# Patient Record
Sex: Female | Born: 1940 | Race: White | Hispanic: No | Marital: Single | Smoking: Never smoker
Health system: Southern US, Community
[De-identification: ages and names within clinical notes are randomized; demographics above are authoritative.]

## PROBLEM LIST (undated history)

## (undated) DIAGNOSIS — K635 Polyp of colon: Secondary | ICD-10-CM

## (undated) DIAGNOSIS — K219 Gastro-esophageal reflux disease without esophagitis: Secondary | ICD-10-CM

## (undated) DIAGNOSIS — Z9889 Other specified postprocedural states: Secondary | ICD-10-CM

## (undated) DIAGNOSIS — E119 Type 2 diabetes mellitus without complications: Secondary | ICD-10-CM

## (undated) DIAGNOSIS — K2270 Barrett's esophagus without dysplasia: Secondary | ICD-10-CM

## (undated) DIAGNOSIS — J45909 Unspecified asthma, uncomplicated: Secondary | ICD-10-CM

## (undated) DIAGNOSIS — I10 Essential (primary) hypertension: Secondary | ICD-10-CM

## (undated) DIAGNOSIS — K859 Acute pancreatitis without necrosis or infection, unspecified: Secondary | ICD-10-CM

## (undated) DIAGNOSIS — K589 Irritable bowel syndrome without diarrhea: Secondary | ICD-10-CM

## (undated) DIAGNOSIS — R112 Nausea with vomiting, unspecified: Secondary | ICD-10-CM

## (undated) DIAGNOSIS — M1990 Unspecified osteoarthritis, unspecified site: Secondary | ICD-10-CM

## (undated) DIAGNOSIS — D509 Iron deficiency anemia, unspecified: Secondary | ICD-10-CM

## (undated) DIAGNOSIS — E785 Hyperlipidemia, unspecified: Secondary | ICD-10-CM

## (undated) DIAGNOSIS — R7611 Nonspecific reaction to tuberculin skin test without active tuberculosis: Secondary | ICD-10-CM

## (undated) HISTORY — PX: BLADDER SUSPENSION: SHX72

## (undated) HISTORY — PX: APPENDECTOMY: SHX54

## (undated) HISTORY — PX: SALPINGECTOMY: SHX328

## (undated) HISTORY — PX: BREAST BIOPSY: SHX20

## (undated) HISTORY — PX: CHOLECYSTECTOMY: SHX55

---

## 2007-11-05 ENCOUNTER — Ambulatory Visit: Payer: Self-pay | Admitting: Internal Medicine

## 2008-08-09 ENCOUNTER — Ambulatory Visit: Payer: Self-pay | Admitting: Gastroenterology

## 2008-08-09 HISTORY — PX: COLONOSCOPY: SHX174

## 2008-08-31 ENCOUNTER — Ambulatory Visit: Payer: Self-pay | Admitting: Gastroenterology

## 2009-02-22 ENCOUNTER — Ambulatory Visit: Payer: Self-pay | Admitting: Internal Medicine

## 2011-03-08 ENCOUNTER — Ambulatory Visit: Payer: Self-pay | Admitting: Internal Medicine

## 2012-09-10 ENCOUNTER — Ambulatory Visit: Payer: Self-pay | Admitting: Internal Medicine

## 2012-09-30 ENCOUNTER — Ambulatory Visit: Payer: Self-pay | Admitting: Internal Medicine

## 2012-10-07 ENCOUNTER — Ambulatory Visit: Payer: Self-pay | Admitting: Obstetrics and Gynecology

## 2012-10-07 LAB — HEMOGLOBIN: HGB: 11.4 g/dL — ABNORMAL LOW (ref 12.0–16.0)

## 2012-10-07 LAB — BASIC METABOLIC PANEL
Anion Gap: 8 (ref 7–16)
BUN: 15 mg/dL (ref 7–18)
Calcium, Total: 9.6 mg/dL (ref 8.5–10.1)
Creatinine: 0.79 mg/dL (ref 0.60–1.30)
Glucose: 78 mg/dL (ref 65–99)
Osmolality: 275 (ref 275–301)
Sodium: 138 mmol/L (ref 136–145)

## 2012-10-13 ENCOUNTER — Ambulatory Visit: Payer: Self-pay | Admitting: Obstetrics and Gynecology

## 2012-10-14 LAB — PATHOLOGY REPORT

## 2013-04-30 HISTORY — PX: LAPAROSCOPIC BILATERAL SALPINGO OOPHERECTOMY: SHX5890

## 2013-10-09 ENCOUNTER — Inpatient Hospital Stay: Payer: Self-pay

## 2013-10-09 LAB — COMPREHENSIVE METABOLIC PANEL
ALBUMIN: 3.4 g/dL (ref 3.4–5.0)
ANION GAP: 12 (ref 7–16)
Alkaline Phosphatase: 50 U/L
BUN: 17 mg/dL (ref 7–18)
Bilirubin,Total: 0.6 mg/dL (ref 0.2–1.0)
CHLORIDE: 101 mmol/L (ref 98–107)
CREATININE: 0.66 mg/dL (ref 0.60–1.30)
Calcium, Total: 9.6 mg/dL (ref 8.5–10.1)
Co2: 21 mmol/L (ref 21–32)
EGFR (African American): >60
GLUCOSE: 198 mg/dL — AB (ref 65–99)
Osmolality: 275 (ref 275–301)
Potassium: 3.3 mmol/L — ABNORMAL LOW (ref 3.5–5.1)
SGOT(AST): 26 U/L (ref 15–37)
SGPT (ALT): 31 U/L (ref 12–78)
Sodium: 134 mmol/L — ABNORMAL LOW (ref 136–145)
TOTAL PROTEIN: 7.1 g/dL (ref 6.4–8.2)

## 2013-10-09 LAB — URINALYSIS, COMPLETE
BILIRUBIN, UR: NEGATIVE
Bacteria: NONE SEEN
Glucose,UR: NEGATIVE mg/dL (ref 0–75)
Leukocyte Esterase: NEGATIVE
NITRITE: NEGATIVE
PH: 5 (ref 4.5–8.0)
PROTEIN: NEGATIVE
RBC,UR: 5 /HPF (ref 0–5)
SPECIFIC GRAVITY: 1.017 (ref 1.003–1.030)

## 2013-10-09 LAB — CBC
HCT: 33.1 % — ABNORMAL LOW (ref 35.0–47.0)
HGB: 10.5 g/dL — AB (ref 12.0–16.0)
MCH: 25.8 pg — ABNORMAL LOW (ref 26.0–34.0)
MCHC: 31.8 g/dL — ABNORMAL LOW (ref 32.0–36.0)
MCV: 81 fL (ref 80–100)
Platelet: 237 10*3/uL (ref 150–440)
RBC: 4.08 10*6/uL (ref 3.80–5.20)
RDW: 18 % — ABNORMAL HIGH (ref 11.5–14.5)
WBC: 14.9 10*3/uL — ABNORMAL HIGH (ref 3.6–11.0)

## 2013-10-09 LAB — LIPASE, BLOOD: Lipase: 157 U/L (ref 73–393)

## 2013-10-09 LAB — TROPONIN I

## 2013-10-10 LAB — CBC WITH DIFFERENTIAL/PLATELET
BASOS PCT: 0.3 %
Basophil #: 0 10*3/uL (ref 0.0–0.1)
EOS ABS: 0.1 10*3/uL (ref 0.0–0.7)
EOS PCT: 0.6 %
HCT: 30.2 % — ABNORMAL LOW (ref 35.0–47.0)
HGB: 9.7 g/dL — AB (ref 12.0–16.0)
Lymphocyte #: 1.6 10*3/uL (ref 1.0–3.6)
Lymphocyte %: 17.2 %
MCH: 26.2 pg (ref 26.0–34.0)
MCHC: 32.2 g/dL (ref 32.0–36.0)
MCV: 81 fL (ref 80–100)
Monocyte #: 0.8 x10 3/mm (ref 0.2–0.9)
Monocyte %: 8.2 %
NEUTROS ABS: 6.8 10*3/uL — AB (ref 1.4–6.5)
NEUTROS PCT: 73.7 %
PLATELETS: 220 10*3/uL (ref 150–440)
RBC: 3.72 10*6/uL — AB (ref 3.80–5.20)
RDW: 18.3 % — ABNORMAL HIGH (ref 11.5–14.5)
WBC: 9.2 10*3/uL (ref 3.6–11.0)

## 2013-10-10 LAB — BASIC METABOLIC PANEL
Anion Gap: 7 (ref 7–16)
BUN: 11 mg/dL (ref 7–18)
Calcium, Total: 8.8 mg/dL (ref 8.5–10.1)
Chloride: 113 mmol/L — ABNORMAL HIGH (ref 98–107)
Co2: 23 mmol/L (ref 21–32)
Creatinine: 0.63 mg/dL (ref 0.60–1.30)
EGFR (African American): >60
Glucose: 66 mg/dL (ref 65–99)
Osmolality: 283 (ref 275–301)
Potassium: 3.8 mmol/L (ref 3.5–5.1)
SODIUM: 143 mmol/L (ref 136–145)

## 2013-10-12 LAB — URINE CULTURE

## 2013-10-12 LAB — BASIC METABOLIC PANEL
Anion Gap: 6 — ABNORMAL LOW (ref 7–16)
BUN: 8 mg/dL (ref 7–18)
CO2: 25 mmol/L (ref 21–32)
CREATININE: 0.78 mg/dL (ref 0.60–1.30)
Calcium, Total: 9 mg/dL (ref 8.5–10.1)
Chloride: 110 mmol/L — ABNORMAL HIGH (ref 98–107)
EGFR (African American): >60
EGFR (Non-African Amer.): >60
Glucose: 100 mg/dL — ABNORMAL HIGH (ref 65–99)
Osmolality: 280 (ref 275–301)
Potassium: 3.6 mmol/L (ref 3.5–5.1)
SODIUM: 141 mmol/L (ref 136–145)

## 2013-10-12 LAB — CBC WITH DIFFERENTIAL/PLATELET
Basophil #: 0.1 10*3/uL (ref 0.0–0.1)
Basophil %: 1 %
EOS ABS: 0.1 10*3/uL (ref 0.0–0.7)
EOS PCT: 2.2 %
HCT: 28.4 % — AB (ref 35.0–47.0)
HGB: 9.4 g/dL — AB (ref 12.0–16.0)
LYMPHS PCT: 22.8 %
Lymphocyte #: 1.4 10*3/uL (ref 1.0–3.6)
MCH: 26.4 pg (ref 26.0–34.0)
MCHC: 33 g/dL (ref 32.0–36.0)
MCV: 80 fL (ref 80–100)
Monocyte #: 0.7 x10 3/mm (ref 0.2–0.9)
Monocyte %: 10.8 %
NEUTROS PCT: 63.2 %
Neutrophil #: 3.9 10*3/uL (ref 1.4–6.5)
PLATELETS: 253 10*3/uL (ref 150–440)
RBC: 3.55 10*6/uL — ABNORMAL LOW (ref 3.80–5.20)
RDW: 17.8 % — AB (ref 11.5–14.5)
WBC: 6.2 10*3/uL (ref 3.6–11.0)

## 2013-10-14 LAB — CULTURE, BLOOD (SINGLE)

## 2013-11-21 ENCOUNTER — Emergency Department: Payer: Self-pay | Admitting: Emergency Medicine

## 2014-04-28 ENCOUNTER — Ambulatory Visit: Payer: Self-pay | Admitting: Internal Medicine

## 2014-05-07 ENCOUNTER — Ambulatory Visit: Payer: Self-pay | Admitting: Internal Medicine

## 2014-06-07 ENCOUNTER — Ambulatory Visit: Payer: Self-pay | Admitting: Gastroenterology

## 2014-06-07 HISTORY — PX: ESOPHAGOGASTRODUODENOSCOPY: SHX1529

## 2014-06-07 HISTORY — PX: COLONOSCOPY: SHX174

## 2014-06-21 ENCOUNTER — Ambulatory Visit: Payer: Self-pay | Admitting: Gastroenterology

## 2014-07-15 ENCOUNTER — Ambulatory Visit
Admit: 2014-07-15 | Disposition: A | Discharge status: Home / Self Care | Payer: Self-pay | Attending: Cardiothoracic Surgery | Admitting: Cardiothoracic Surgery

## 2014-07-30 ENCOUNTER — Ambulatory Visit
Admit: 2014-07-30 | Disposition: A | Discharge status: Home / Self Care | Payer: Self-pay | Attending: Cardiothoracic Surgery | Admitting: Cardiothoracic Surgery

## 2014-08-20 NOTE — Op Note (Signed)
PATIENT NAME:  [name], [name] DATE OF BIRTH:  1940-10-29  DATE OF PROCEDURE:  10/13/2012  PREOPERATIVE DIAGNOSIS: Complex right ovarian cyst.   POSTOPERATIVE DIAGNOSES:  1.  Significant pelvic adhesions.  2.  Complex right ovarian cyst.   PROCEDURE:  1.  Pelvic adhesiolysis incorporating greater than 50% of operating time. 2.  Bilateral salpingo-oophorectomy.   ANESTHESIA: General endotracheal anesthesia.  SURGEON: Suzy Bouchardhomas J Verdelle Valtierra, M.D.   FIRST ASSISTANT: [name] BoresEvans.   INDICATIONS: This is a [age] year old gravida 3, para 2 patient with a noted complex right adnexal mass measuring 3.8 x 3.1 with papillations within the ovarian mass. Normal CA-125.   PROCEDURE: After adequate general endotracheal anesthesia, the patient was placed in the dorsal supine position. The abdomen, perineum and vagina were prepped and draped in normal sterile fashion. A single-tooth tenaculum applied on the anterior cervix and the bladder was straight catheterized yielding 75 mL clear urine. An infraumbilical incision was made approximately 11 mm after injecting with 0.5% Marcaine. The laparoscope was advanced into the abdominal cavity with the Optiview cannula. The patient's abdomen was insufflated with carbon dioxide and a second port site was made 2 fingerbreadths medial to the left anterior iliac spine. Direct visualization was used to advance a 10 mm trocar into the left lower quadrant. A third port was placed 3 cm medial to the right anterior iliac spine. A 5 mm trocar was advanced into the abdominal cavity under direct visualization. Initial impression was bilateral ovarian encasement to the ovarian fossa and several adhesions noted from the posterior aspect of the uterus to the bowel.   Attention was then directed to the patient's left fallopian tube, which again was adhesed and encased into the left ovarian fossa. Gentle and meticulous dissection performed with traction, countertraction and  Harmonic scalpel allowed for fraying of the ovary. Ureter was identified with normal peristaltic activity pre- and post removal of tube and ovary. The infundibulopelvic ligament was then cauterized with Kleppingers and Harmonic scalpel. Ultimately, the left fallopian tube and ovary were removed without difficulty. They were placed in an Endobag and removed from the abdomen.   Attention was directed to the patient's right fallopian tube, which again had multiple adhesions onto the sidewall. Again,  meticulous dissection was utilized after identifying the course of the right ureter. The infundibulopelvic ligament was dissected, cauterized and the right fallopian tube and ovary were removed with the Harmonic scalpel. The uterus appeared approximately eight weeks in size with a bulbous component to the midportion consistent with a fibroid. Good hemostasis was noted. At the end of the case, good peristaltic activity identified from the ureters bilaterally. The patient's right fallopian tube and ovary was removed intact. The patient's abdomen was then inspected. The upper abdomen appeared normal. The patient's abdomen was deflated and the infraumbilical and left lower quadrant port sites were closed in 2 layers with a fascial layer closed with 2-0 Vicryl suture and all 3 incisions were closed with interrupted 4-0 Vicryl sutures. Steri-Strips were applied. Tegaderm applied. Single-tooth tenaculum was removed from the anterior cervix. Good hemostasis was noted. There were no complications. The patient tolerated the procedure well and was taken to the recovery room in good condition.  ____________________________ Suzy Bouchardhomas J. Connar Keating, MD tjs:aw D: 10/13/2012 11:07:14 ET T: 10/13/2012 11:22:56 ET JOB#: [id number]  cc: Suzy Bouchardhomas J. Delenn Ahn, MD, <Dictator> Suzy BouchardHOMAS J Beni Turrell MD ELECTRONICALLY SIGNED 10/17/2012 8:37

## 2014-08-21 NOTE — H&P (Signed)
PATIENT NAME:  [name], [name] MR#:  [id number] DATE OF BIRTH:  07-25-1940  DATE OF ADMISSION:  10/09/2013  ADMITTING PHYSICIAN: Gladstone Lighter, MD  PRIMARY CARE PHYSICIAN: Mckenzie Filbert, MD  CHIEF COMPLAINT: Epigastric abdominal pain and also dysuria.   HISTORY OF PRESENT ILLNESS: [name] is a very pleasant 74 year old Caucasian female with past medical history significant for hypertension, diabetes, arthritis and asthma who lives at home by herself, comes to the ER with the above-mentioned complaints. The patient says she started feeling sick about 3 to 4 days ago. She could not pinpoint what exactly the cause was. She was having some epigastric pain, right-sided chest pain whenever she takes deep breaths or moves. She also noticed that she was having epigastric abdominal pain with pain on urination, burning and dribbling with urination. Her symptoms got worse late yesterday and this morning she was not feeling any better. She had a temperature of 101 degrees Fahrenheit at home so presents to the hospital. Here she has a fever of 99.4 degrees Fahrenheit. She was tachycardic with pulse of 126 and an elevated white count. Her urine did not reveal any infection, but she does have an infiltrate on chest x-ray and is being admitted for possible sepsis from pneumonia.   PAST MEDICAL HISTORY: 1.  Diabetes mellitus.  2.  Hypertension.  3.  Osteoarthritis.  4.  Asthma.   PAST SURGICAL HISTORY: 1.  Cholecystectomy.  2.  Bilateral salpingo-oophorectomy for benign right complex ovarian cyst. 3.  Appendectomy. 4.  Uterine suspension.  ALLERGIES:  1.  Augmentin.  2.  Codeine.  3.  Demerol. 4.  Levaquin.  5.  Morphine.  6.  Sulfa drugs.  7.  Wine and raisins.  CURRENT HOME MEDICATIONS:  1.  Aspirin 81 mg p.o. daily.  2.  Glimepiride 4 mg p.o. daily.  3.  Metformin 1000 mg p.o. b.i.d.  4.  Centrum Silver multivitamin 1 tablet p.o. daily.  5.  HCTZ/triamterene 25/37.5 mg 1 tablet  daily.  6.  Klor-Con 20 mEq p.o. daily.  7.  Lipitor 40 mg p.o. daily.  8.  Lisinopril 5 mg p.o. daily.  9.  Magnesium oxide 500 mg p.o. at bedtime.  10.  Metoprolol extended release 100 mg p.o. daily.  11.  Prilosec 20 mg p.o. daily.  12.  Pulmicort 180 mcg/inhalation powder 1 puff once a day.  13.  TriCor 145 mg p.o. daily.  14.  Ventolin inhaler 2 puffs 4 times a day as needed for shortness of breath.  15.  Zyrtec 10 mg p.o. daily as needed for allergies.   SOCIAL HISTORY: Lives at home by herself. Very active at baseline, even volunteers at church for several activities. No history of any smoking or alcohol use.   FAMILY HISTORY: Significant for diabetes running in the family and sister with breast cancer.   REVIEW OF SYSTEMS: CONSTITUTIONAL: Positive for fever, fatigue and weakness.  EYES: No blurred vision, double vision, inflammation or glaucoma.  ENT: No tinnitus, ear pain, hearing loss, epistaxis or discharge.  RESPIRATORY: No cough, wheeze, hemoptysis or COPD. CARDIOVASCULAR: Positive for chest pain. No orthopnea, edema, arrhythmia, palpitations, or syncope.  GASTROINTESTINAL: No nausea, vomiting, or diarrhea. Positive for abdominal pain. No hematemesis or melena.  GENITOURINARY: Positive for dysuria. No hematuria. No frequency or incontinence.  ENDOCRINE: No polyuria, nocturia, thyroid problems, heat or cold intolerance.  HEMATOLOGY: No anemia, easy bruising or bleeding.  SKIN: No acne, rash or lesions.  MUSCULOSKELETAL: Positive for arthritis. No gout. No  neck, back or shoulder pain.  NEUROLOGIC: No numbness, weakness, CVA, TIA or seizures.  PSYCHOLOGICAL: No anxiety, insomnia, depression.   PHYSICAL EXAMINATION: VITAL SIGNS: Temperature 99.4 degrees Fahrenheit, pulse 126, respirations 18, blood pressure 150/72, pulse ox 94% on room air.  GENERAL: Well-built, well-nourished female lying in bed, not in any acute distress.  HEENT: Normocephalic, atraumatic. Pupils equal,  round and reacting to light. Anicteric sclerae. Extraocular movements intact. Oropharynx clear without erythema, mass or exudates. Very dry mucous membranes are noted.  NECK: Supple. No thyromegaly, JVD or carotid bruits. No lymphadenopathy. Normal range of motion without pain.  LUNGS: Clear to auscultation bilaterally. No wheeze or crackles. No use of accessory muscles for breathing.  HEART:  S1, S2. Regular rate and rhythm. A 3/6 systolic murmur heard. No rubs or gallops.  ABDOMEN: Soft. Mild discomfort in the right upper quadrant and also in the hypogastric region. No guarding or rigidity. No hepatosplenomegaly. Normal bowel sounds.  EXTREMITIES: No pedal edema. No clubbing or cyanosis. 2+ dorsalis pedis pulses palpable bilaterally.  SKIN: No acne, rash or lesions.  LYMPHATICS: No cervical lymphadenopathy.  NEUROLOGIC: Cranial nerves intact. No focal motor or sensory deficits.  PSYCHIATRIC: The patient is awake, alert and oriented x3.  DIAGNOSTIC DATA: WBC 14.9, hemoglobin 10.5, hematocrit 33.1, platelet count 237,000.   Sodium 134, potassium 3.3, chloride 101, bicarb 21, BUN 17, creatinine 0.6, glucose 198, calcium 9.6.   ALT 31, AST 26, alk phos 50, total bili 0.6, albumin 3.4. Troponin less than 0.02. Lipase 157.  Urinalysis: Negative for any infection. Lactic acid 0.8.   CT of the abdomen and pelvis is showing large esophageal hiatal hernia containing much of the stomach, calcified granulomas are noted in the lungs, hilum and spleen. Patchy nodular ground-glass infiltrates superior segmental left lower lobe and left lingula, probably due to aspiration pneumonia or inflammatory process. No ureteral or renal stone or obstruction noted. Calcified fibroids in uterus are noted.   EKG showing sinus tachycardia, heart rate of 121. No acute ST-T wave abnormalities.   ASSESSMENT AND PLAN: This is a 74 year old female with hypertension, diabetes, asthma, and arthritis admitted for sepsis.  1.   Sepsis, likely secondary to left lower lobe pneumonia as seen on chest x-ray and CT. Blood cultures have been ordered. Started on Rocephin and azithromycin. IV fluids and antibiotics with Rocephin and azithromycin started as well.  2.  Dysuria and abdominal pain. Urinalysis is negative. It is more in the right upper quadrant. Not sure if it is like pain from her esophageal hiatal hernia and she could also have bladder spasms.  CT of the abdomen is done. No adhesions or obstruction noted.  3.  Hypertension. Continue home medications.  4.  Hypokalemia. It is being replaced.  5.  Diabetes mellitus. Continue sliding scale insulin, metformin and glimepiride.  6.  Asthma. Stable on inhalers.  7.  Deep vein thrombosis prophylaxis with Lovenox.  CODE STATUS: FULL.  TIME SPENT ON ADMISSION: 50 minutes.   ____________________________ Gladstone Lighter, MD rk:sb D: 10/09/2013 08:58:27 ET T: 10/09/2013 09:46:57 ET JOB#: [id number]  cc: Gladstone Lighter, MD, <Dictator> Rusty Aus, MD Gladstone Lighter MD ELECTRONICALLY SIGNED 10/10/2013 15:48

## 2014-08-21 NOTE — Discharge Summary (Signed)
PATIENT NAME:  [name], [name] MR#:  [id number] DATE OF BIRTH:  [birth year]  DATE OF ADMISSION:  10/09/2013 DATE OF DISCHARGE:  10/12/2013  DISCHARGE DIAGNOSES:  1. Sepsis.  2. Pneumonia.  3. Diabetes.  4. Hypertension.   HISTORY OF PRESENT ILLNESS: This is a pleasant 74 year old female with history of diabetes, hypertension, asthma, and arthritis, who came in with fever, tachycardia, and abdominal pain. She had a CT on chest x-ray that showed pneumonia. Abdomen x-ray, was negative for any source of abdominal pain except a large hiatal hernia. Blood cultures were negative. UA and urine cultures were also negative. The patient was treated with IV antibiotics, ceftriaxone and azithromycin as well as fluids. Her fever improved. Her symptoms improved, and she remained afebrile and off of oxygen. White blood count decreased from 14.9 to 6.2.   Her sugars did have some decreases of hypoglycemia but she was being covered with sliding scale insulin. Her blood pressure remained well controlled.   DISCHARGE MEDICATIONS: Please see physician discharge summary. Briefly, she will continue her outpatient medications except she will restart amaryl  tomorrow at half a tablet. She can increase this as needed if her sugars remain elevated. She was advised I would rather her sugars be a little high than a little low until she resumes her normal oral intake. I have also started her on azithromycin and she will finish 3 more days.   DISCHARGE DIET: Carbohydrate, ADA-controlled diet, regular consistency.   DISCHARGE FOLLOWUP: Dr. Hyacinth MeekerMiller in 1-2 weeks.  TIME SPENT: This discharge took 35 minutes.    ____________________________ Stann Mainlandavid P. Sampson GoonFitzgerald, MD dpf:lt D: 10/12/2013 15:09:16 ET T: 10/12/2013 19:35:58 ET JOB#: [id number]  cc: Stann Mainlandavid P. Sampson GoonFitzgerald, MD, <Dictator> Oswin Johal Sampson GoonFITZGERALD MD ELECTRONICALLY SIGNED 10/13/2013 6:50

## 2014-08-23 LAB — SURGICAL PATHOLOGY

## 2014-08-30 ENCOUNTER — Other Ambulatory Visit: Payer: Self-pay | Admitting: Surgery

## 2014-08-30 DIAGNOSIS — K2100 Gastro-esophageal reflux disease with esophagitis, without bleeding: Secondary | ICD-10-CM

## 2014-08-30 DIAGNOSIS — K449 Diaphragmatic hernia without obstruction or gangrene: Secondary | ICD-10-CM

## 2014-09-01 ENCOUNTER — Ambulatory Visit
Admission: RE | Admit: 2014-09-01 | Discharge: 2014-09-01 | Disposition: A | Discharge status: Home / Self Care | Payer: Medicare Other | Source: Ambulatory Visit | Attending: Surgery | Admitting: Surgery

## 2014-09-01 DIAGNOSIS — K2100 Gastro-esophageal reflux disease with esophagitis, without bleeding: Secondary | ICD-10-CM

## 2014-09-01 DIAGNOSIS — K449 Diaphragmatic hernia without obstruction or gangrene: Secondary | ICD-10-CM

## 2014-09-01 HISTORY — DX: Essential (primary) hypertension: I10

## 2014-09-01 HISTORY — DX: Gastro-esophageal reflux disease without esophagitis: K21.9

## 2014-09-01 HISTORY — DX: Irritable bowel syndrome without diarrhea: K58.9

## 2014-09-01 HISTORY — DX: Type 2 diabetes mellitus without complications: E11.9

## 2014-09-01 HISTORY — DX: Unspecified asthma, uncomplicated: J45.909

## 2014-09-01 MED ORDER — TECHNETIUM TC 99M SULFUR COLLOID
2.1600 | Freq: Once | INTRAVENOUS | Status: AC | PRN
  Administered 2014-09-01: 2.16 via ORAL

## 2014-09-22 ENCOUNTER — Ambulatory Visit
Admission: RE | Admit: 2014-09-22 | Discharge: 2014-09-22 | Disposition: A | Discharge status: Home / Self Care | Payer: Medicare Other | Source: Ambulatory Visit | Attending: Gastroenterology | Admitting: Gastroenterology

## 2014-09-22 ENCOUNTER — Encounter
Admission: RE | Disposition: A | Discharge status: Home / Self Care | Payer: Self-pay | Source: Ambulatory Visit | Attending: Gastroenterology

## 2014-09-22 DIAGNOSIS — R12 Heartburn: Secondary | ICD-10-CM | POA: Insufficient documentation

## 2014-09-22 DIAGNOSIS — Z7951 Long term (current) use of inhaled steroids: Secondary | ICD-10-CM | POA: Insufficient documentation

## 2014-09-22 DIAGNOSIS — R05 Cough: Secondary | ICD-10-CM | POA: Diagnosis not present

## 2014-09-22 DIAGNOSIS — Z79899 Other long term (current) drug therapy: Secondary | ICD-10-CM | POA: Insufficient documentation

## 2014-09-22 HISTORY — PX: ESOPHAGEAL MANOMETRY: SHX5429

## 2014-09-22 SURGERY — MANOMETRY, ESOPHAGUS
Anesthesia: General

## 2014-09-22 MED ORDER — BUTAMBEN-TETRACAINE-BENZOCAINE 2-2-14 % EX AERO
1.0000 | INHALATION_SPRAY | Freq: Once | CUTANEOUS | Status: AC
  Administered 2014-09-22: 1 via TOPICAL

## 2014-09-22 MED ORDER — LIDOCAINE HCL 2 % EX GEL
1.0000 "application " | Freq: Once | CUTANEOUS | Status: AC
  Administered 2014-09-22: 5 via TOPICAL

## 2014-09-22 SURGICAL SUPPLY — 2 items
FACESHIELD LNG OPTICON STERILE (SAFETY) IMPLANT
GLOVE BIO SURGEON STRL SZ8 (GLOVE) ×6 IMPLANT

## 2014-09-23 ENCOUNTER — Encounter: Payer: Self-pay | Admitting: Gastroenterology

## 2014-10-28 ENCOUNTER — Encounter
Admission: RE | Admit: 2014-10-28 | Discharge: 2014-10-28 | Disposition: A | Discharge status: Home / Self Care | Payer: Medicare Other | Source: Ambulatory Visit | Attending: Internal Medicine | Admitting: Internal Medicine

## 2014-10-28 DIAGNOSIS — Z01812 Encounter for preprocedural laboratory examination: Secondary | ICD-10-CM | POA: Insufficient documentation

## 2014-10-28 HISTORY — DX: Acute pancreatitis without necrosis or infection, unspecified: K85.90

## 2014-10-28 HISTORY — DX: Unspecified osteoarthritis, unspecified site: M19.90

## 2014-10-28 NOTE — OR Nursing (Signed)
Called office regarding allergy to Levaquin and Augmentin.

## 2014-10-28 NOTE — Patient Instructions (Signed)
  Your procedure is scheduled on: 11/04/14 Thurs Report to Day Surgery. To find out your arrival time please call [phone] between 1PM - 3PM on 11/03/14 Wed.  Remember: Instructions that are not followed completely may result in serious medical risk, up to and including death, or upon the discretion of your surgeon and anesthesiologist your surgery may need to be rescheduled.    _x___ 1. Do not eat food or drink liquids after midnight. No gum chewing or hard candies.     ____ 2. No Alcohol for 24 hours before or after surgery.   ____ 3. Bring all medications with you on the day of surgery if instructed.    __x__ 4. Notify your doctor if there is any change in your medical condition     (cold, fever, infections).     Do not wear jewelry, make-up, hairpins, clips or nail polish.  Do not wear lotions, powders, or perfumes. You may wear deodorant.  Do not shave 48 hours prior to surgery. Men may shave face and neck.  Do not bring valuables to the hospital.    [facility] is not responsible for any belongings or valuables.               Contacts, dentures or bridgework may not be worn into surgery.  Leave your suitcase in the car. After surgery it may be brought to your room.  For patients admitted to the hospital, discharge time is determined by your                treatment team.   Patients discharged the day of surgery will not be allowed to drive home.   Please read over the following fact sheets that you were given:      __x__ Take these medicines the morning of surgery with A SIP OF WATER:    1. albuterol (PROVENTIL HFA;VENTOLIN HFA) 108 (90 BASE) MCG/ACT inhaler  2. fluticasone-salmeterol (ADVAIR HFA) 115-21 MCG/ACT inhaler  3. metoprolol succinate (TOPROL-XL) 100 MG 24 hr tablet  4.montelukast (SINGULAIR) 10 MG tablet  5.omeprazole (PRILOSEC) 20 MG capsule  6.  ____ Fleet Enema (as directed)   __x__ Use CHG Soap as directed  ____ Use inhalers on the day of  surgery  __x__ Stop metformin 2 days prior to surgery    ____ Take 1/2 of usual insulin dose the night before surgery and none on the morning of surgery.   _x___ Stop Coumadin/Plavix/aspirin on stop aspirin 10/28/14  ____ Stop Anti-inflammatories on    ____ Stop supplements until after surgery.    ____ Bring C-Pap to the hospital.

## 2014-11-04 ENCOUNTER — Ambulatory Visit: Payer: Medicare Other | Admitting: Anesthesiology

## 2014-11-04 ENCOUNTER — Encounter
Admission: RE | Disposition: A | Discharge status: Home / Self Care | Payer: Self-pay | Source: Ambulatory Visit | Attending: Surgery

## 2014-11-04 ENCOUNTER — Encounter: Payer: Self-pay | Admitting: Anesthesiology

## 2014-11-04 ENCOUNTER — Inpatient Hospital Stay
Admission: RE | Admit: 2014-11-04 | Discharge: 2014-11-09 | DRG: 328 | Disposition: A | Discharge status: Home / Self Care | Payer: Medicare Other | Source: Ambulatory Visit | Attending: Surgery | Admitting: Surgery

## 2014-11-04 DIAGNOSIS — K210 Gastro-esophageal reflux disease with esophagitis: Secondary | ICD-10-CM | POA: Diagnosis present

## 2014-11-04 DIAGNOSIS — K449 Diaphragmatic hernia without obstruction or gangrene: Secondary | ICD-10-CM | POA: Diagnosis present

## 2014-11-04 DIAGNOSIS — J45909 Unspecified asthma, uncomplicated: Secondary | ICD-10-CM | POA: Diagnosis present

## 2014-11-04 DIAGNOSIS — R1319 Other dysphagia: Secondary | ICD-10-CM | POA: Diagnosis not present

## 2014-11-04 DIAGNOSIS — I10 Essential (primary) hypertension: Secondary | ICD-10-CM | POA: Diagnosis present

## 2014-11-04 DIAGNOSIS — M1990 Unspecified osteoarthritis, unspecified site: Secondary | ICD-10-CM | POA: Diagnosis present

## 2014-11-04 DIAGNOSIS — D508 Other iron deficiency anemias: Secondary | ICD-10-CM | POA: Diagnosis present

## 2014-11-04 DIAGNOSIS — E119 Type 2 diabetes mellitus without complications: Secondary | ICD-10-CM | POA: Diagnosis present

## 2014-11-04 DIAGNOSIS — K219 Gastro-esophageal reflux disease without esophagitis: Secondary | ICD-10-CM | POA: Diagnosis present

## 2014-11-04 HISTORY — PX: LAPAROSCOPIC NISSEN FUNDOPLICATION: SHX1932

## 2014-11-04 HISTORY — PX: HIATAL HERNIA REPAIR: SHX195

## 2014-11-04 SURGERY — REPAIR, HERNIA, HIATAL, LAPAROSCOPIC
Anesthesia: General | Wound class: Clean

## 2014-11-04 MED ORDER — MIDAZOLAM HCL 5 MG/5ML IJ SOLN
INTRAMUSCULAR | Status: DC | PRN
  Administered 2014-11-04: 2 mg via INTRAVENOUS

## 2014-11-04 MED ORDER — ACETAMINOPHEN 10 MG/ML IV SOLN
INTRAVENOUS | Status: DC | PRN
  Administered 2014-11-04: 1000 mg via INTRAVENOUS

## 2014-11-04 MED ORDER — ACETAMINOPHEN 325 MG PO TABS: 650.0000 mg | ORAL_TABLET | ORAL | Status: DC | PRN

## 2014-11-04 MED ORDER — KCL IN DEXTROSE-NACL 20-5-0.2 MEQ/L-%-% IV SOLN
INTRAVENOUS | Status: DC
  Administered 2014-11-04 – 2014-11-07 (×2): via INTRAVENOUS
  Filled 2014-11-04 (×10): qty 1000

## 2014-11-04 MED ORDER — EPHEDRINE SULFATE 50 MG/ML IJ SOLN
INTRAMUSCULAR | Status: DC | PRN
  Administered 2014-11-04 (×3): 10 mg via INTRAVENOUS

## 2014-11-04 MED ORDER — HYDROCODONE-ACETAMINOPHEN 5-325 MG PO TABS
ORAL_TABLET | ORAL | Status: AC
  Administered 2014-11-04: 1
  Filled 2014-11-04: qty 1

## 2014-11-04 MED ORDER — BUPIVACAINE-EPINEPHRINE (PF) 0.5% -1:200000 IJ SOLN
INTRAMUSCULAR | Status: DC | PRN
  Administered 2014-11-04: 25 mL

## 2014-11-04 MED ORDER — HEPARIN SODIUM (PORCINE) 5000 UNIT/ML IJ SOLN
INTRAMUSCULAR | Status: AC
  Filled 2014-11-04: qty 1

## 2014-11-04 MED ORDER — GLYCOPYRROLATE 0.2 MG/ML IJ SOLN
INTRAMUSCULAR | Status: DC | PRN
  Administered 2014-11-04: 0.6 mg via INTRAVENOUS

## 2014-11-04 MED ORDER — NEOSTIGMINE METHYLSULFATE 10 MG/10ML IV SOLN
INTRAVENOUS | Status: DC | PRN
  Administered 2014-11-04: 4 mg via INTRAVENOUS

## 2014-11-04 MED ORDER — HYDROCODONE-ACETAMINOPHEN 5-325 MG PO TABS
1.0000 | ORAL_TABLET | ORAL | Status: DC | PRN
  Administered 2014-11-05: 1 via ORAL
  Administered 2014-11-05 (×2): 2 via ORAL
  Filled 2014-11-04: qty 1
  Filled 2014-11-04 (×2): qty 2

## 2014-11-04 MED ORDER — HEPARIN SODIUM (PORCINE) 5000 UNIT/ML IJ SOLN
5000.0000 [IU] | Freq: Two times a day (BID) | INTRAMUSCULAR | Status: DC
  Administered 2014-11-05 – 2014-11-08 (×8): 5000 [IU] via SUBCUTANEOUS
  Filled 2014-11-04 (×8): qty 1

## 2014-11-04 MED ORDER — ACETAMINOPHEN 650 MG RE SUPP: 650.0000 mg | Freq: Four times a day (QID) | RECTAL | Status: DC | PRN

## 2014-11-04 MED ORDER — BUPIVACAINE-EPINEPHRINE (PF) 0.5% -1:200000 IJ SOLN
INTRAMUSCULAR | Status: AC
  Filled 2014-11-04: qty 30

## 2014-11-04 MED ORDER — TRIAMTERENE-HCTZ 37.5-25 MG PO TABS
1.0000 | ORAL_TABLET | Freq: Every day | ORAL | Status: DC
  Administered 2014-11-05 – 2014-11-09 (×4): 1 via ORAL
  Filled 2014-11-04 (×4): qty 1

## 2014-11-04 MED ORDER — FENTANYL CITRATE (PF) 100 MCG/2ML IJ SOLN: 25.0000 ug | INTRAMUSCULAR | Status: DC | PRN

## 2014-11-04 MED ORDER — PANTOPRAZOLE SODIUM 40 MG PO TBEC
40.0000 mg | DELAYED_RELEASE_TABLET | Freq: Every day | ORAL | Status: DC
  Administered 2014-11-05 – 2014-11-06 (×2): 40 mg via ORAL
  Filled 2014-11-04 (×2): qty 1

## 2014-11-04 MED ORDER — PROPOFOL 10 MG/ML IV BOLUS
INTRAVENOUS | Status: DC | PRN
  Administered 2014-11-04: 120 mg via INTRAVENOUS
  Administered 2014-11-04: 20 mg via INTRAVENOUS

## 2014-11-04 MED ORDER — SODIUM CHLORIDE 0.9 % IV SOLN
INTRAVENOUS | Status: DC
  Administered 2014-11-04 (×2): via INTRAVENOUS

## 2014-11-04 MED ORDER — CEFAZOLIN SODIUM 1-5 GM-% IV SOLN
INTRAVENOUS | Status: AC
  Administered 2014-11-04: 1 g via INTRAVENOUS
  Filled 2014-11-04: qty 50

## 2014-11-04 MED ORDER — LISINOPRIL 5 MG PO TABS
5.0000 mg | ORAL_TABLET | Freq: Every day | ORAL | Status: DC
  Administered 2014-11-06 – 2014-11-09 (×3): 5 mg via ORAL
  Filled 2014-11-04 (×4): qty 1

## 2014-11-04 MED ORDER — MONTELUKAST SODIUM 10 MG PO TABS
10.0000 mg | ORAL_TABLET | Freq: Every morning | ORAL | Status: DC
  Administered 2014-11-05 – 2014-11-09 (×4): 10 mg via ORAL
  Filled 2014-11-04 (×4): qty 1

## 2014-11-04 MED ORDER — ONDANSETRON HCL 4 MG/2ML IJ SOLN
INTRAMUSCULAR | Status: AC
  Filled 2014-11-04: qty 2

## 2014-11-04 MED ORDER — HYDROMORPHONE HCL 1 MG/ML IJ SOLN: 0.5000 mg | INTRAMUSCULAR | Status: DC | PRN

## 2014-11-04 MED ORDER — ONDANSETRON HCL 4 MG/2ML IJ SOLN
4.0000 mg | Freq: Once | INTRAMUSCULAR | Status: AC
  Administered 2014-11-04: 4 mg via INTRAVENOUS

## 2014-11-04 MED ORDER — CEFAZOLIN SODIUM 1-5 GM-% IV SOLN
1.0000 g | Freq: Once | INTRAVENOUS | Status: AC
  Administered 2014-11-04 (×2): 1 g via INTRAVENOUS

## 2014-11-04 MED ORDER — LIDOCAINE HCL (CARDIAC) 20 MG/ML IV SOLN
INTRAVENOUS | Status: DC | PRN
  Administered 2014-11-04: 50 mg via INTRAVENOUS

## 2014-11-04 MED ORDER — ONDANSETRON HCL 4 MG/2ML IJ SOLN
INTRAMUSCULAR | Status: DC | PRN
  Administered 2014-11-04: 4 mg via INTRAVENOUS

## 2014-11-04 MED ORDER — MOMETASONE FURO-FORMOTEROL FUM 100-5 MCG/ACT IN AERO
2.0000 | INHALATION_SPRAY | Freq: Two times a day (BID) | RESPIRATORY_TRACT | Status: DC
  Administered 2014-11-05 – 2014-11-09 (×9): 2 via RESPIRATORY_TRACT
  Filled 2014-11-04: qty 8.8

## 2014-11-04 MED ORDER — ONDANSETRON HCL 4 MG/2ML IJ SOLN: 4.0000 mg | Freq: Four times a day (QID) | INTRAMUSCULAR | Status: DC | PRN

## 2014-11-04 MED ORDER — FENTANYL CITRATE (PF) 250 MCG/5ML IJ SOLN
INTRAMUSCULAR | Status: DC | PRN
  Administered 2014-11-04: 100 ug via INTRAVENOUS
  Administered 2014-11-04: 50 ug via INTRAVENOUS
  Administered 2014-11-04: 25 ug via INTRAVENOUS
  Administered 2014-11-04: 50 ug via INTRAVENOUS
  Administered 2014-11-04: 25 ug via INTRAVENOUS

## 2014-11-04 MED ORDER — METOPROLOL SUCCINATE ER 100 MG PO TB24
100.0000 mg | ORAL_TABLET | Freq: Every day | ORAL | Status: DC
  Administered 2014-11-05 – 2014-11-09 (×4): 100 mg via ORAL
  Filled 2014-11-04 (×4): qty 1

## 2014-11-04 MED ORDER — ROCURONIUM BROMIDE 100 MG/10ML IV SOLN
INTRAVENOUS | Status: DC | PRN
  Administered 2014-11-04: 10 mg via INTRAVENOUS
  Administered 2014-11-04: 40 mg via INTRAVENOUS
  Administered 2014-11-04: 5 mg via INTRAVENOUS
  Administered 2014-11-04: 10 mg via INTRAVENOUS
  Administered 2014-11-04: 5 mg via INTRAVENOUS

## 2014-11-04 MED ORDER — ALBUTEROL SULFATE (2.5 MG/3ML) 0.083% IN NEBU: 3.0000 mL | INHALATION_SOLUTION | RESPIRATORY_TRACT | Status: DC | PRN

## 2014-11-04 MED ORDER — ACETAMINOPHEN 10 MG/ML IV SOLN
INTRAVENOUS | Status: AC
Start: 2014-11-04 — End: 2014-11-04
  Filled 2014-11-04: qty 100

## 2014-11-04 MED ORDER — SODIUM CHLORIDE 0.9 % IV SOLN
INTRAVENOUS | Status: DC | PRN
  Administered 2014-11-04: 1000 mL

## 2014-11-04 MED ORDER — PHENYLEPHRINE HCL 10 MG/ML IJ SOLN
INTRAMUSCULAR | Status: DC | PRN
  Administered 2014-11-04 (×14): 100 ug via INTRAVENOUS

## 2014-11-04 SURGICAL SUPPLY — 49 items
APPLIER CLIP ROT 10 11.4 M/L (STAPLE) ×3
CANISTER SUCT 1200ML W/VALVE (MISCELLANEOUS) ×3 IMPLANT
CANNULA DILATOR 10 W/SLV (CANNULA) ×2 IMPLANT
CANNULA DILATOR 10MM W/SLV (CANNULA) ×1
CANNULA DILATOR 12 W/SLV (CANNULA) IMPLANT
CANNULA DILATOR 12MM W/SLV (CANNULA)
CATH TRAY 16F METER LATEX (MISCELLANEOUS) IMPLANT
CHLORAPREP W/TINT 26ML (MISCELLANEOUS) ×3 IMPLANT
CLIP APPLIE ROT 10 11.4 M/L (STAPLE) ×1 IMPLANT
CLOSURE WOUND 1/2 X4 (GAUZE/BANDAGES/DRESSINGS)
DEVICE SUTURE ENDOST 10MM (ENDOMECHANICALS) ×6 IMPLANT
DISSECTOR KITTNER STICK (MISCELLANEOUS) ×2 IMPLANT
DISSECTORS/KITTNER STICK (MISCELLANEOUS) ×6
GAUZE SPONGE 4X4 12PLY STRL (GAUZE/BANDAGES/DRESSINGS) ×3 IMPLANT
GLOVE BIO SURGEON STRL SZ7.5 (GLOVE) ×12 IMPLANT
GLOVE INDICATOR 7.0 STRL GRN (GLOVE) ×9 IMPLANT
GOWN STRL REUS W/ TWL LRG LVL3 (GOWN DISPOSABLE) ×4 IMPLANT
GOWN STRL REUS W/TWL LRG LVL3 (GOWN DISPOSABLE) ×8
IRRIGATION STRYKERFLOW (MISCELLANEOUS) ×1 IMPLANT
IRRIGATOR STRYKERFLOW (MISCELLANEOUS) ×3
IV NS 1000ML (IV SOLUTION) ×2
IV NS 1000ML BAXH (IV SOLUTION) ×1 IMPLANT
KIT RM TURNOVER STRD PROC AR (KITS) ×3 IMPLANT
LABEL OR SOLS (LABEL) ×3 IMPLANT
LIQUID BAND (GAUZE/BANDAGES/DRESSINGS) IMPLANT
MESH BIO-A 7X10 SYN MAT (Mesh General) ×3 IMPLANT
NDL INSUFF ACCESS 14 VERSASTEP (NEEDLE) ×3 IMPLANT
NS IRRIG 500ML POUR BTL (IV SOLUTION) ×3 IMPLANT
PACK LAP CHOLECYSTECTOMY (MISCELLANEOUS) ×3 IMPLANT
PAD GROUND ADULT SPLIT (MISCELLANEOUS) ×3 IMPLANT
RED CAP ×3 IMPLANT
RETRACT II ENDO 10MM 32CML (ENDOMECHANICALS) ×3
RETRACTOR II ENDO 10MM 32CML (ENDOMECHANICALS) ×1 IMPLANT
SCISSORS METZENBAUM CVD 33 (INSTRUMENTS) ×3 IMPLANT
SEAL FOR SCOPE WARMER C3101 (MISCELLANEOUS) ×3 IMPLANT
SHEARS HARMONIC ACE PLUS 36CM (ENDOMECHANICALS) ×3 IMPLANT
STAPLER HERNIA 12 8.5 360D (INSTRUMENTS) ×2 IMPLANT
STRIP CLOSURE SKIN 1/2X4 (GAUZE/BANDAGES/DRESSINGS) IMPLANT
SUT CHROMIC 4 0 RB 1X27 (SUTURE) IMPLANT
SUT CHROMIC 4 0 SH 27 (SUTURE) ×6 IMPLANT
SUT CHROMIC 5 0 RB 1 27 (SUTURE) IMPLANT
SUT ENDO SURGIDAC 0 7  GRN ES9 (SUTURE) ×22
SUT ENDO SURGIDAC 0 7 GRN ES9 (SUTURE) ×11
SUTURE ENDO SURGDC 0 7 GRN ES9 (SUTURE) ×11 IMPLANT
TROCAR XCEL NON-BLD 11X100MML (ENDOMECHANICALS) ×3 IMPLANT
TROCAR XCEL UNIV SLVE 11M 100M (ENDOMECHANICALS) ×9 IMPLANT
TUBING INSUFFLATOR HEATED (MISCELLANEOUS) ×3 IMPLANT
UNIVERSAL HERNIA STAPLER ×3 IMPLANT
WATER STERILE IRR 1000ML POUR (IV SOLUTION) ×3 IMPLANT

## 2014-11-04 NOTE — Anesthesia Preprocedure Evaluation (Signed)
Anesthesia Evaluation  Patient identified by MRN, date of birth, ID band Patient awake    Reviewed: Allergy & Precautions, NPO status , Patient's Chart, lab work & pertinent test results, reviewed documented beta blocker date and time   Airway Mallampati: II  TM Distance: >3 FB     Dental  (+) Chipped   Pulmonary asthma ,          Cardiovascular hypertension,     Neuro/Psych  Neuromuscular disease    GI/Hepatic hiatal hernia, GERD-  ,  Endo/Other  diabetes  Renal/GU      Musculoskeletal  (+) Arthritis -,   Abdominal   Peds  Hematology   Anesthesia Other Findings   Reproductive/Obstetrics                             Anesthesia Physical Anesthesia Plan  ASA: II  Anesthesia Plan: General   Post-op Pain Management:    Induction: Intravenous  Airway Management Planned: Oral ETT  Additional Equipment:   Intra-op Plan:   Post-operative Plan:   Informed Consent: I have reviewed the patients History and Physical, chart, labs and discussed the procedure including the risks, benefits and alternatives for the proposed anesthesia with the patient or authorized representative who has indicated his/her understanding and acceptance.     Plan Discussed with: CRNA  Anesthesia Plan Comments:         Anesthesia Quick Evaluation

## 2014-11-04 NOTE — Op Note (Signed)
OPERATIVE REPORT  PREOPERATIVE  DIAGNOSIS: . Hiatus hernia with gastroesophageal reflux  POSTOPERATIVE DIAGNOSIS: . Hiatus hernia with gastroesophageal reflux  PROCEDURE: . Laparoscopic hiatus hernia repair with mesh with fundoplication  ANESTHESIA:  General  SURGEON: Renda Rolls  MD   INDICATIONS: . She is a history of gastroesophageal reflux and radiographic findings of large hiatus hernia. Has had significant persistent symptoms despite treatment. Surgery was recommended for definitive treatment.  The patient was placed on the operating table in the supine position under general endotracheal anesthesia. The abdomen was prepared with ChloraPrep and draped in a sterile manner. An incision was made just about 4 cm above the umbilicus and carried down through subcutaneous tissues. The deep fascia was grasped with a laryngeal hook and elevated. A Veress needle was inserted into the peritoneal cavity aspirated and irrigated with saline solution. The peritoneal cavity was insufflated with carbon dioxide. The 10 mm 0 laparoscope was inserted to view the peritoneal cavity. The liver appeared normal the visible left and right colon and intestines appeared normal. The patient was placed in the reverse Trendelenburg position. Another incision was made in the subxiphoid position to insert an 11 mm cannula. Another incision was made in the left upper quadrant at the costal margin to insert an 11 mm cannula. Another incision was made in the right upper quadrant at the costal margin at the midclavicular line to insert another 11 mm cannula. Another incision was made in the right upper quadrant midway between the midclavicular line and the umbilicus to insert an 11 mm cannula. Another incision was made at the anterior axillary line of the right upper quadrant to introduce an 11 mm cannula. There were a total of 6 incisions.  The 5 finger fan retractor was introduced to retract the left lobe of the liver and was  held in place with the Buchwalter mechanical arm. The whole stomach was herniated into the chest. Traction was applied as the stomach was manipulated down into the abdomen. The gastrohepatic ligament was incised with the Harmonic scalpel. The hernia sac was incised circumferentially separating it from the diaphragm and from the left and right crus. This was a very tedious dissection. The stomach was brought down into the abdomen so that the esophagus progressed some 4 cm below the diaphragm. It is noted that one left gastric vein was divided between endoclips  with Harmonic scalpel. The hiatus was inspected and clearly demonstrated the left and right crus and separated peritoneum and hernia sac from the defect circumferentially.  The repair was carried out using 0 Surgidac sutures suturing the left crus to the right crus narrowing the hiatus around the esophagus. A Gore-Tex bio A absorbable mesh was cut to create a U-shape and was placed over the repair and attached to the repair with a stapling instrument  A window was created posterior to the esophagus 3 cm in dimension. A portion of the fundus of the stomach was brought posterior to the esophagus from left to right. Another portion of the fundus was brought adjacent to this. The fundoplication was carried out with 0 Surgidac sutures placing 3 sutures approximate 1 cm apart also used 2 Gore-Tex bio A pledgets. The wrap appeared to be satisfactorily floppy.  There was minimal blood loss during the course of the procedure amounting to approximately 10 cc. The laparoscopic instruments were removed. The wounds were infiltrated with half percent Sensorcaine with epinephrine. Skin incisions were closed with interrupted 4-0 chromic subcuticular sutures and LiquiBand.  The patient  tolerated the procedure satisfactorily and was carried to the recovery room for postoperative care. This procedure was more difficult than the typical hiatus hernia repair as the entire  stomach was herniated into the chest and took a somewhat tedious dissection and 4 hours to complete the procedure.  Renda RollsWilton Smith M.D.

## 2014-11-04 NOTE — H&P (Signed)
  She reports no change in condition since the day of the office visit.  She has been off of aspirin for more than a week.  She is awake alert and oriented. Lungs sounds are clear. Heart regular rhythm S1-S2 without murmur.  Plan for surgery was discussed  Armond HangWilton Smith M.D.

## 2014-11-04 NOTE — Transfer of Care (Signed)
Immediate Anesthesia Transfer of Care Note  Patient: [name]  Procedure(s) Performed: Procedure(s): LAPAROSCOPIC REPAIR OF HIATAL HERNIA (N/A) LAPAROSCOPIC NISSEN FUNDOPLICATION (N/A)  Patient Location: PACU  Anesthesia Type:General  Level of Consciousness: awake and alert   Airway & Oxygen Therapy: Patient Spontanous Breathing and Patient connected to nasal cannula oxygen  Post-op Assessment: Report given to RN  Post vital signs: Reviewed  Last Vitals:  Filed Vitals:   11/04/14 1947  BP: 116/46  Pulse: 86  Temp: 37 C  Resp: 19    Complications: No apparent anesthesia complications

## 2014-11-04 NOTE — Anesthesia Procedure Notes (Addendum)
Procedure Name: Intubation Date/Time: 11/04/2014 3:08 PM Performed by: Mathews ArgyleLOGAN, Mckenzie Mikkelsen Pre-anesthesia Checklist: Patient identified, Patient being monitored, Timeout performed, Emergency Drugs available and Suction available Patient Re-evaluated:Patient Re-evaluated prior to inductionOxygen Delivery Method: Circle system utilized Preoxygenation: Pre-oxygenation with 100% oxygen Intubation Type: IV induction Ventilation: Mask ventilation without difficulty Laryngoscope Size: Mac and 3 Grade View: Grade I Tube type: Oral Tube size: 7.0 mm Number of attempts: 1 Airway Equipment and Method: Stylet Placement Confirmation: ETT inserted through vocal cords under direct vision,  positive ETCO2 and breath sounds checked- equal and bilateral Secured at: 20 cm Tube secured with: Tape Dental Injury: Teeth and Oropharynx as per pre-operative assessment

## 2014-11-05 ENCOUNTER — Encounter: Payer: Self-pay | Admitting: Surgery

## 2014-11-05 DIAGNOSIS — K210 Gastro-esophageal reflux disease with esophagitis: Secondary | ICD-10-CM

## 2014-11-05 DIAGNOSIS — K449 Diaphragmatic hernia without obstruction or gangrene: Secondary | ICD-10-CM

## 2014-11-05 LAB — CBC
HEMATOCRIT: 33.1 % — AB (ref 35.0–47.0)
Hemoglobin: 10.7 g/dL — ABNORMAL LOW (ref 12.0–16.0)
MCH: 26.1 pg (ref 26.0–34.0)
MCHC: 32.2 g/dL (ref 32.0–36.0)
MCV: 81.1 fL (ref 80.0–100.0)
Platelets: 180 10*3/uL (ref 150–440)
RBC: 4.08 MIL/uL (ref 3.80–5.20)
RDW: 18.8 % — ABNORMAL HIGH (ref 11.5–14.5)
WBC: 8.4 10*3/uL (ref 3.6–11.0)

## 2014-11-05 LAB — BASIC METABOLIC PANEL
Anion gap: 7 (ref 5–15)
BUN: 16 mg/dL (ref 6–20)
CALCIUM: 8.4 mg/dL — AB (ref 8.9–10.3)
CO2: 25 mmol/L (ref 22–32)
Chloride: 105 mmol/L (ref 101–111)
Creatinine, Ser: 0.76 mg/dL (ref 0.44–1.00)
GFR calc Af Amer: >60 mL/min (ref >60)
GFR calc non Af Amer: >60 mL/min (ref >60)
GLUCOSE: 257 mg/dL — AB (ref 65–99)
POTASSIUM: 3.8 mmol/L (ref 3.5–5.1)
SODIUM: 137 mmol/L (ref 135–145)

## 2014-11-05 MED ORDER — INSULIN ASPART 100 UNIT/ML ~~LOC~~ SOLN
0.0000 [IU] | Freq: Three times a day (TID) | SUBCUTANEOUS | Status: DC
  Administered 2014-11-05: 3 [IU] via SUBCUTANEOUS
  Administered 2014-11-05: 5 [IU] via SUBCUTANEOUS
  Administered 2014-11-05: 3 [IU] via SUBCUTANEOUS
  Administered 2014-11-06: 2 [IU] via SUBCUTANEOUS
  Administered 2014-11-06: 5 [IU] via SUBCUTANEOUS
  Administered 2014-11-07 (×2): 3 [IU] via SUBCUTANEOUS
  Administered 2014-11-07: 2 [IU] via SUBCUTANEOUS
  Administered 2014-11-09: 3 [IU] via SUBCUTANEOUS
  Administered 2014-11-09: 8 [IU] via SUBCUTANEOUS
  Filled 2014-11-05: qty 8
  Filled 2014-11-05: qty 5
  Filled 2014-11-05: qty 2
  Filled 2014-11-05: qty 3
  Filled 2014-11-05: qty 5
  Filled 2014-11-05: qty 2
  Filled 2014-11-05: qty 8
  Filled 2014-11-05 (×4): qty 3

## 2014-11-05 MED ORDER — POLYETHYLENE GLYCOL 3350 17 G PO PACK
17.0000 g | PACK | Freq: Every day | ORAL | Status: DC
  Administered 2014-11-05 – 2014-11-08 (×3): 17 g via ORAL
  Filled 2014-11-05 (×3): qty 1

## 2014-11-05 MED ORDER — METFORMIN HCL 500 MG PO TABS
1000.0000 mg | ORAL_TABLET | Freq: Two times a day (BID) | ORAL | Status: DC
  Administered 2014-11-05 – 2014-11-06 (×3): 1000 mg via ORAL
  Filled 2014-11-05 (×3): qty 2

## 2014-11-05 MED ORDER — ATORVASTATIN CALCIUM 20 MG PO TABS
40.0000 mg | ORAL_TABLET | Freq: Every day | ORAL | Status: DC
  Administered 2014-11-05 – 2014-11-08 (×3): 40 mg via ORAL
  Filled 2014-11-05 (×3): qty 2

## 2014-11-05 MED ORDER — GLIMEPIRIDE 2 MG PO TABS
2.0000 mg | ORAL_TABLET | Freq: Two times a day (BID) | ORAL | Status: DC
  Administered 2014-11-05 – 2014-11-06 (×3): 2 mg via ORAL
  Filled 2014-11-05 (×3): qty 1

## 2014-11-05 NOTE — Op Note (Signed)
Author: Nadeen LandauJarvis Wilton Ketrina Boateng, MD Service: Surgery Author Type: Physician   Filed: 11/04/2014 8:25 PM Note Time: 11/04/2014 8:11 PM Status: Signed   Editor: Nadeen LandauJarvis Wilton Neale Marzette, MD (Physician)     Expand All Collapse All   AMENDED OPERATIVE REPORT  PREOPERATIVE DIAGNOSIS: . Hiatus hernia with gastroesophageal reflux  POSTOPERATIVE DIAGNOSIS: . Hiatus hernia with gastroesophageal reflux  PROCEDURE: . Laparoscopic hiatus hernia repair with mesh with fundoplication  ANESTHESIA: General  SURGEON: Renda RollsWilton Yanett Conkright MD  ASSISTANT SURGEON:  Hoover Brownsimothy Oaksa   INDICATIONS: . She is a history of gastroesophageal reflux and radiographic findings of large hiatus hernia. Has had significant persistent symptoms despite treatment. Surgery was recommended for definitive treatment.  The patient was placed on the operating table in the supine position under general endotracheal anesthesia. The abdomen was prepared with ChloraPrep and draped in a sterile manner. An incision was made just about 4 cm above the umbilicus and carried down through subcutaneous tissues. The deep fascia was grasped with a laryngeal hook and elevated. A Veress needle was inserted into the peritoneal cavity aspirated and irrigated with saline solution. The peritoneal cavity was insufflated with carbon dioxide. The 10 mm 0 laparoscope was inserted to view the peritoneal cavity. The liver appeared normal the visible left and right colon and intestines appeared normal. The patient was placed in the reverse Trendelenburg position. Another incision was made in the subxiphoid position to insert an 11 mm cannula. Another incision was made in the left upper quadrant at the costal margin to insert an 11 mm cannula. Another incision was made in the right upper quadrant at the costal margin at the midclavicular line to insert another 11 mm cannula. Another incision was made in the right upper quadrant midway between the midclavicular line and the  umbilicus to insert an 11 mm cannula. Another incision was made at the anterior axillary line of the right upper quadrant to introduce an 11 mm cannula. There were a total of 6 incisions.  The 5 finger fan retractor was introduced to retract the left lobe of the liver and was held in place with the Buchwalter mechanical arm. The whole stomach was herniated into the chest. Traction was applied as the stomach was manipulated down into the abdomen. The gastrohepatic ligament was incised with the Harmonic scalpel. The hernia sac was incised circumferentially separating it from the diaphragm and from the left and right crus. This was a very tedious dissection. The stomach was brought down into the abdomen so that the esophagus progressed some 4 cm below the diaphragm. It is noted that one left gastric vein was divided between endoclips with Harmonic scalpel. The hiatus was inspected and clearly demonstrated the left and right crus and separated peritoneum and hernia sac from the defect circumferentially.  The repair was carried out using 0 Surgidac sutures suturing the left crus to the right crus narrowing the hiatus around the esophagus. A Gore-Tex bio A absorbable mesh was cut to create a U-shape and was placed over the repair and attached to the repair with a stapling instrument  A window was created posterior to the esophagus 3 cm in dimension. A portion of the fundus of the stomach was brought posterior to the esophagus from left to right. Another portion of the fundus was brought adjacent to this. The fundoplication was carried out with 0 Surgidac sutures placing 3 sutures approximate 1 cm apart also used 2 Gore-Tex bio A pledgets. The wrap appeared to be satisfactorily floppy.  There was  minimal blood loss during the course of the procedure amounting to approximately 10 cc. The laparoscopic instruments were removed. The wounds were infiltrated with half percent Sensorcaine with epinephrine. Skin incisions  were closed with interrupted 4-0 chromic subcuticular sutures and LiquiBand.  The patient tolerated the procedure satisfactorily and was carried to the recovery room for postoperative care. This procedure was more difficult than the typical hiatus hernia repair as the entire stomach was herniated into the chest and took a somewhat tedious dissection and 4 hours to complete the procedure.  Renda Rolls M.D.

## 2014-11-05 NOTE — Progress Notes (Signed)
I have seen her 3 times today.  She is tolerating her clear liquids.  Full liquids have been ordered 2 times this afternoon but so far has just gotten clear liquids.  The nurse   Just placed another call to try to get full liquids. Plan MiraLax to help avoid constipation

## 2014-11-05 NOTE — Op Note (Signed)
Patient Name Sex DOB SSN   [name], [name] Female March 05, 1941 [id number]    Op Note by Nadeen Landau, MD at 11/05/2014 7:32 AM    Author: Nadeen Landau, MD Service: Surgery Author Type: Physician   Filed: 11/05/2014 7:34 AM Note Time: 11/05/2014 7:32 AM Status: Signed   Editor: Nadeen Landau, MD (Physician)     Expand All Collapse All    Author: Nadeen Landau, MD Service: Surgery Author Type: Physician   Filed: 11/04/2014 8:25 PM Note Time: 11/04/2014 8:11 PM Status: Signed   Editor: Nadeen Landau, MD (Physician)     Expand All Collapse All  AMENDED OPERATIVE REPORT  PREOPERATIVE DIAGNOSIS: . Hiatus hernia with gastroesophageal reflux  POSTOPERATIVE DIAGNOSIS: . Hiatus hernia with gastroesophageal reflux  PROCEDURE: . Laparoscopic hiatus hernia repair with mesh with fundoplication  ANESTHESIA: General  SURGEON: Renda Rolls MD  ASSISTANT SURGEON: Hulda Marin   INDICATIONS: . She is a history of gastroesophageal reflux and radiographic findings of large hiatus hernia. Has had significant persistent symptoms despite treatment. Surgery was recommended for definitive treatment.  The patient was placed on the operating table in the supine position under general endotracheal anesthesia. The abdomen was prepared with ChloraPrep and draped in a sterile manner. An incision was made just about 4 cm above the umbilicus and carried down through subcutaneous tissues. The deep fascia was grasped with a laryngeal hook and elevated. A Veress needle was inserted into the peritoneal cavity aspirated and irrigated with saline solution. The peritoneal cavity was insufflated with carbon dioxide. The 10 mm 0 laparoscope was inserted to view the peritoneal cavity. The liver appeared normal the visible left and right colon and intestines appeared normal. The patient was placed in the reverse Trendelenburg position. Another incision was made in the  subxiphoid position to insert an 11 mm cannula. Another incision was made in the left upper quadrant at the costal margin to insert an 11 mm cannula. Another incision was made in the right upper quadrant at the costal margin at the midclavicular line to insert another 11 mm cannula. Another incision was made in the right upper quadrant midway between the midclavicular line and the umbilicus to insert an 11 mm cannula. Another incision was made at the anterior axillary line of the right upper quadrant to introduce an 11 mm cannula. There were a total of 6 incisions.  The 5 finger fan retractor was introduced to retract the left lobe of the liver and was held in place with the Buchwalter mechanical arm. The whole stomach was herniated into the chest. Traction was applied as the stomach was manipulated down into the abdomen. The gastrohepatic ligament was incised with the Harmonic scalpel. The hernia sac was incised circumferentially separating it from the diaphragm and from the left and right crus. This was a very tedious dissection. The stomach was brought down into the abdomen so that the esophagus progressed some 4 cm below the diaphragm. It is noted that one left gastric vein was divided between endoclips with Harmonic scalpel. The hiatus was inspected and clearly demonstrated the left and right crus and separated peritoneum and hernia sac from the defect circumferentially.  The repair was carried out using 0 Surgidac sutures suturing the left crus to the right crus narrowing the hiatus around the esophagus. A Gore-Tex bio A absorbable mesh was cut to create a U-shape and was placed over the repair and attached to the repair with a stapling instrument  A window was  created posterior to the esophagus 3 cm in dimension. A portion of the fundus of the stomach was brought posterior to the esophagus from left to right. Another portion of the fundus was brought adjacent to this. The fundoplication was carried out  with 0 Surgidac sutures placing 3 sutures approximate 1 cm apart also used 2 Gore-Tex bio A pledgets. The wrap appeared to be satisfactorily floppy.  There was minimal blood loss during the course of the procedure amounting to approximately 10 cc. The laparoscopic instruments were removed. The wounds were infiltrated with half percent Sensorcaine with epinephrine. Skin incisions were closed with interrupted 4-0 chromic subcuticular sutures and LiquiBand.  The patient tolerated the procedure satisfactorily and was carried to the recovery room for postoperative care. This procedure was more difficult than the typical hiatus hernia repair as the entire stomach was herniated into the chest and took a somewhat tedious dissection and 4 hours to complete the procedure.  Renda RollsWilton Smith M.D.

## 2014-11-05 NOTE — Progress Notes (Signed)
Per Dr. Katrinka BlazingSmith pt may have full liquid carb control diet

## 2014-11-05 NOTE — Progress Notes (Signed)
She reports moderate discomfort this morning. She has no nausea. She has been voiding satisfactorily. She has ambulated to the bedside commode.  She is awake alert and oriented. Lung sounds are clear. Her 6 incisions appear to be healing satisfactorily. With glue remaining intact. Abdomen is with mild epigastric tenderness.  Lab work was noted  Impression good progress, hyperglycemia  Plan is to begin a clear liquid diet, taper IV, and sliding-scale insulin, ambulate in hallway

## 2014-11-05 NOTE — Progress Notes (Signed)
She is drinking her clear liquids and tolerating well.  She reports no nausea.  She has been walking in the hallway.  Minimal pain.  She is awake alert and oriented.  Abdomen is soft with minimal tenderness.  Plan is to advance to full liquid diet, resume Amaryl , metformin, Lipitor

## 2014-11-06 MED ORDER — POTASSIUM CHLORIDE 20 MEQ PO PACK
20.0000 meq | PACK | Freq: Every day | ORAL | Status: DC
  Administered 2014-11-06: 20 meq via ORAL
  Filled 2014-11-06: qty 1

## 2014-11-06 NOTE — Progress Notes (Signed)
She is tolerating a full liquid diet. She has had some mild pain in the right mid abdomen. She is taking MiraLAX but has not yet passed any gas or had a bowel movement. She is walking in the hallway. She is breathing easily.  Vital signs are stable  She is awake alert and oriented. Abdomen is with minimal tenderness and soft and flat. Her 6 incisions appear to be healing satisfactorily with glue remaining intact.  Impression satisfactory progress  She lives alone and does not yet feel capable of going home but thinks she may be able to go home tomorrow.  Plan is to advance to a diabetic soft diet. Rx liquid potassium

## 2014-11-07 MED ORDER — PANTOPRAZOLE SODIUM 40 MG IV SOLR
40.0000 mg | INTRAVENOUS | Status: DC
  Administered 2014-11-08: 40 mg via INTRAVENOUS
  Filled 2014-11-07: qty 40

## 2014-11-07 MED ORDER — ACETAMINOPHEN 650 MG RE SUPP
650.0000 mg | RECTAL | Status: DC | PRN
  Administered 2014-11-07: 650 mg via RECTAL
  Filled 2014-11-07: qty 1

## 2014-11-07 MED ORDER — KCL IN DEXTROSE-NACL 20-5-0.2 MEQ/L-%-% IV SOLN: INTRAVENOUS | Status: DC

## 2014-11-07 NOTE — Progress Notes (Signed)
She has developed difficulty swallowing this morning. She began to eat some eggs and applesauce and grits but was unable to keep this down and split some of that back up. She reports some moderate discomfort in the sternal area.. She reports no abdominal pain. She has not had any bowel movement since surgery. She has been walking.  Vital signs are stable. She is awake alert and oriented. I had her sit up and attempt to swallow some water. She did swallow 2 times and it stayed down but felt uncomfortable. Abdomen is soft and flat with no significant tenderness. Her incisions are healing satisfactorily.  Impression dysphasia  Plan is to discontinue her diet. Resume IV fluids. Assess her progress tomorrow. May consider barium swallow

## 2014-11-08 LAB — GLUCOSE, CAPILLARY: Glucose-Capillary: 174 mg/dL — ABNORMAL HIGH (ref 65–99)

## 2014-11-08 NOTE — Progress Notes (Signed)
She reports she is tolerating the full liquids better.  She thinks she is taken approximately 1/3 of her lunch tray.  She reports minimal discomfort with swallowing.  She has passed some flatus per rectum.  She reports no abdominal pain.  Plan is to discontinue her IV.  Possible discharge tomorrow.

## 2014-11-08 NOTE — Care Management (Signed)
Important Message  Patient Details  Name: Mckenzie Bolognaatricia L Hoback MRN: [id number] Date of Birth: [birth year]   Medicare Important Message Given:  Yes-second notification given    Olegario MessierKathy A Allmond 11/08/2014, 10:17 AM

## 2014-11-08 NOTE — Progress Notes (Signed)
She reports feeling some better today.  She was able to drink some apple juice last night.  She has also had approximately 2 cups of ice chips yesterday.  She is walking in the hallway.  She is having no difficulty breathing.  Vital signs are stable.  She is awake alert and oriented.  Abdomen is soft flat with minimal degree of tenderness on deeper palpation.  Her 6 incisions appear to be healing satisfactorily.  I had her drink 1 oz of water during the course of surgical examination.  She has some mild dysphagia but is improved over yesterday.  Impression postoperative dysphasia.  Has to offer full liquid diet today.  Taper IV.  Not yet ready for discharge

## 2014-11-09 LAB — GLUCOSE, CAPILLARY
Glucose-Capillary: 187 mg/dL — ABNORMAL HIGH (ref 65–99)
Glucose-Capillary: 262 mg/dL — ABNORMAL HIGH (ref 65–99)

## 2014-11-09 LAB — PLATELET COUNT: Platelets: 199 10*3/uL (ref 150–440)

## 2014-11-09 MED ORDER — ACETAMINOPHEN 325 MG PO TABS: 650.0000 mg | ORAL_TABLET | ORAL | Status: AC | PRN

## 2014-11-09 NOTE — Care Management (Signed)
Spoke with patient for discharge planning. A and [place] [employer] independent drives self.  No DME or o2,  Support of friends.No CM needs identified.

## 2014-11-09 NOTE — Discharge Instructions (Signed)
Slowly advance diet.  Discontinue omeprazole Thursday.  Take Tylenol if needed for pain.  Avoid straining and heavy lifting.  Call office to make follow up appointment for about 2 weeks after surgery.

## 2014-11-09 NOTE — Progress Notes (Signed)
Alert and oriented. VSS. No signs of acute distress. Tolerating diet. Incision dry and intact. Order received to dc patient home. Discharge instructions given. Patient verbalized understanding.

## 2014-11-15 NOTE — Anesthesia Postprocedure Evaluation (Signed)
  Anesthesia Post-op Note  Patient: [name]  Procedure(s) Performed: Procedure(s): LAPAROSCOPIC REPAIR OF HIATAL HERNIA (N/A) LAPAROSCOPIC NISSEN FUNDOPLICATION (N/A)  Anesthesia type:General  Patient location: PACU  Post pain: Pain level controlled  Post assessment: Post-op Vital signs reviewed, Patient's Cardiovascular Status Stable, Respiratory Function Stable, Patent Airway and No signs of Nausea or vomiting  Post vital signs: Reviewed and stable  Last Vitals:  Filed Vitals:   11/09/14 0900  BP: 124/58  Pulse: 81  Temp: 36.9 C  Resp: 18    Level of consciousness: awake, alert  and patient cooperative  Complications: No apparent anesthesia complications

## 2014-11-18 NOTE — Discharge Summary (Signed)
DISCHARGE SUMMARY  This 74 year old female came in prepared   for laparoscopic hiatus hernia repair with fundoplication.  She  has a history of gastroesophageal reflux also a history of aspiration pneumonia.  CT scan demonstrated a large hiatus hernia.  Manometry demonstrated satisfactory esophageal body peristalsis.  Gastric emptying study demonstrated satisfactory gastric emptying . She has unknown short-segment of Barrett's esophagus.  Past medical history includes hypertension and type 2 diabetes.  She also has a history of iron deficiency anemia.  Details recorded on the typed H&P  with medicine list.  She was brought in through the outpatient surgery department and carried to the operating room.  She did have a preop prophylactic antibiotic.  She had a laparoscopic hiatus hernia repair  reinforced with Gore-Tex bio a mesh.  She also had a fundoplication.  Postoperatively she was kept in the hospital for a period of observation and IV fluids.  She was given subcutaneous prophylactic heparin.  She initially began  clear liquid diet and later advanced to full liquid diet.  On the 3rd postoperative day did have some significant dysphagia and was kept in the hospital for additional time and given IV fluids.  By the 4th postoperative day her dysphagia was improved.  By the 5th postoperative day she  had satisfactory oral intake and was in satisfactory condition for discharge.  Final diagnosis hiatus hernia with gastroesophageal reflux  Operation laparoscopic hiatus hernia repair with fundoplication  Wound care instructions were given.  Plan to stop omeprazole 1 week after surgery.  Also plan for follow-up in the office  Anticipate follow-up surveillance endoscopy  Renda Rolls MD

## 2015-05-03 ENCOUNTER — Other Ambulatory Visit: Payer: Self-pay | Admitting: Internal Medicine

## 2015-05-03 DIAGNOSIS — Z1231 Encounter for screening mammogram for malignant neoplasm of breast: Secondary | ICD-10-CM

## 2015-05-12 ENCOUNTER — Ambulatory Visit: Payer: PRIVATE HEALTH INSURANCE

## 2015-05-18 ENCOUNTER — Ambulatory Visit
Admission: RE | Admit: 2015-05-18 | Discharge: 2015-05-18 | Disposition: A | Discharge status: Home / Self Care | Payer: Medicare Other | Source: Ambulatory Visit | Attending: Internal Medicine | Admitting: Internal Medicine

## 2015-05-18 ENCOUNTER — Other Ambulatory Visit: Payer: Self-pay | Admitting: Internal Medicine

## 2015-05-18 DIAGNOSIS — Z1231 Encounter for screening mammogram for malignant neoplasm of breast: Secondary | ICD-10-CM | POA: Diagnosis not present

## 2015-05-23 ENCOUNTER — Other Ambulatory Visit: Payer: Self-pay | Admitting: Internal Medicine

## 2015-05-23 DIAGNOSIS — R928 Other abnormal and inconclusive findings on diagnostic imaging of breast: Secondary | ICD-10-CM

## 2015-06-03 ENCOUNTER — Ambulatory Visit
Admission: RE | Admit: 2015-06-03 | Discharge: 2015-06-03 | Disposition: A | Discharge status: Home / Self Care | Payer: Medicare Other | Source: Ambulatory Visit | Attending: Internal Medicine | Admitting: Internal Medicine

## 2015-06-03 ENCOUNTER — Other Ambulatory Visit: Payer: Self-pay | Admitting: Internal Medicine

## 2015-06-03 DIAGNOSIS — R928 Other abnormal and inconclusive findings on diagnostic imaging of breast: Secondary | ICD-10-CM

## 2016-02-12 IMAGING — CT CT CHEST W/ CM
2 of 3 series · 15 of 36 positions shown, 18 images · IV contrast (omnipaque)
Comparison: None.

CLINICAL DATA: Large hiatal hernia. Epigastric pain common nausea,
and dysphagia.

EXAM:
CT CHEST WITH CONTRAST
TECHNIQUE: Multidetector CT imaging of the chest was performed during
intravenous contrast administration.
CONTRAST:  75 mL Omnipaque 300

[Series 2: routine chest with · axial · 0.65mm/px · z∈[-716,-461]mm · 12 of 61 slices shown, 15 images]
[im 5/61  mediastinal]
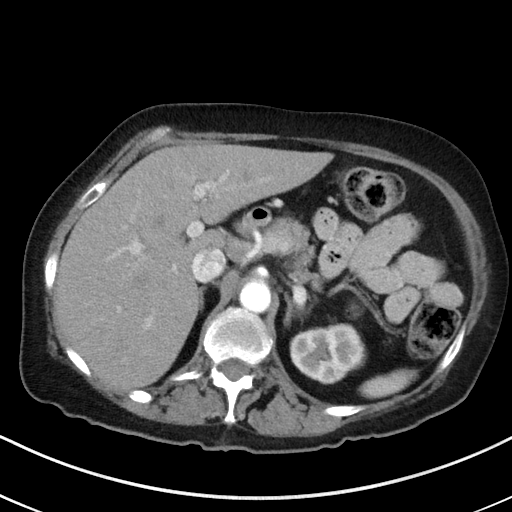
[im 5/61  lung]
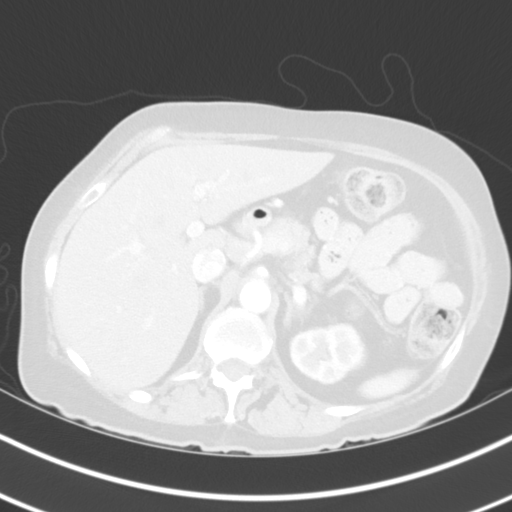
[im 9/61  lung]
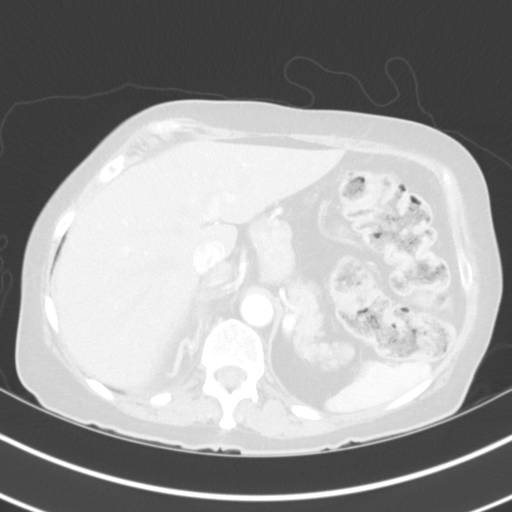
[im 14/61  lung]
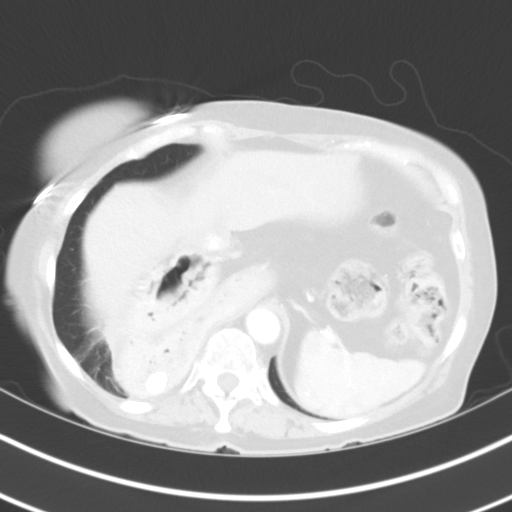
[im 18/61  lung]
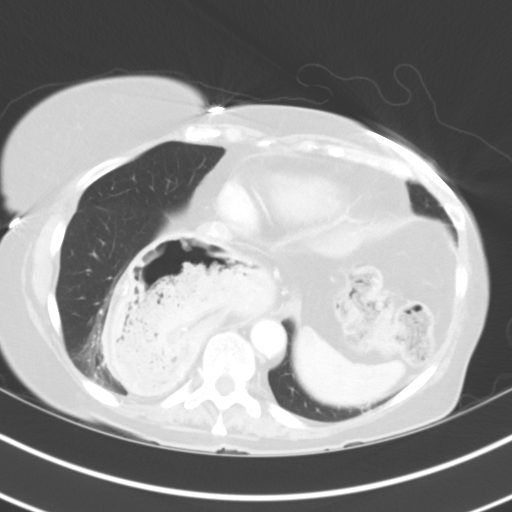
[im 23/61  mediastinal]
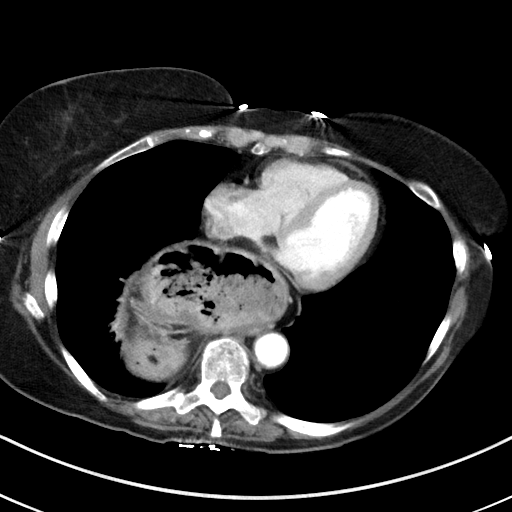
[im 23/61  lung]
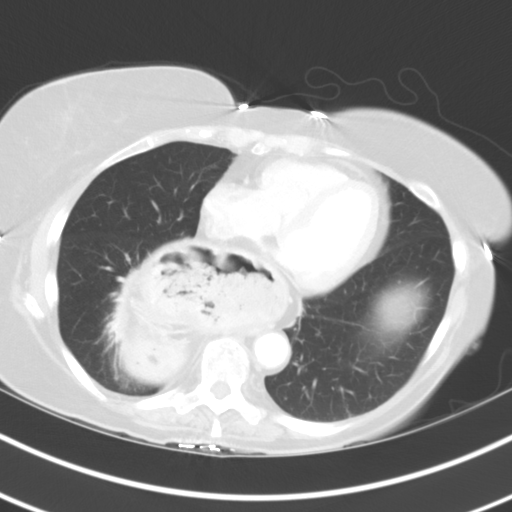
[im 27/61  lung]
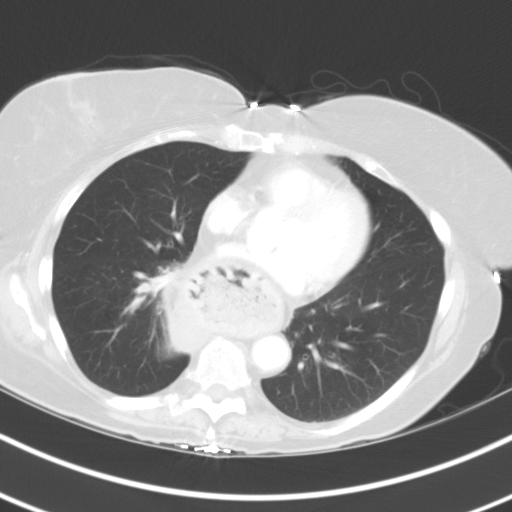
[im 34/61  lung]
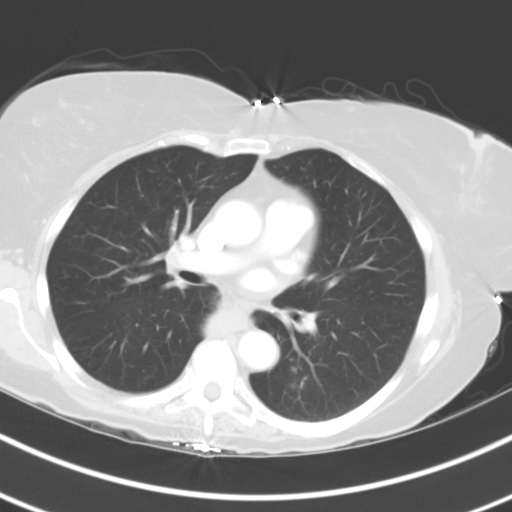
[im 38/61  lung]
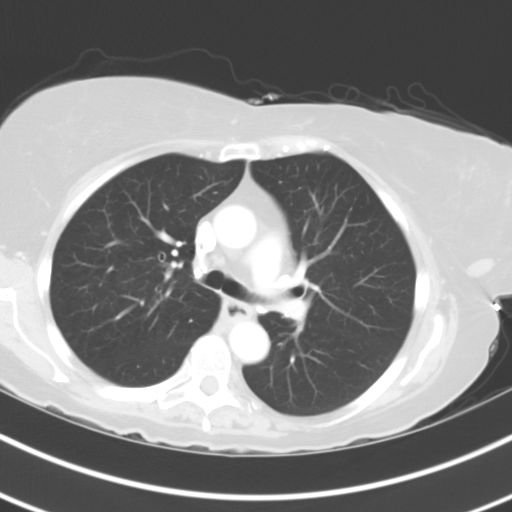
[im 43/61  mediastinal]
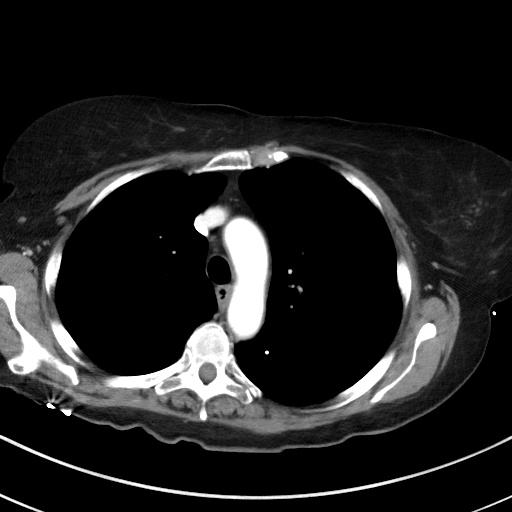
[im 43/61  lung]
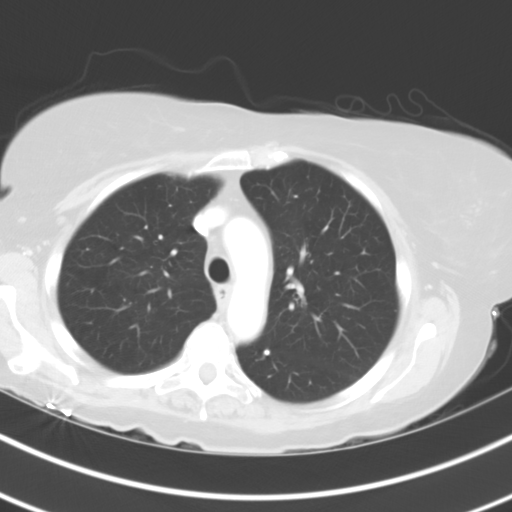
[im 47/61  lung]
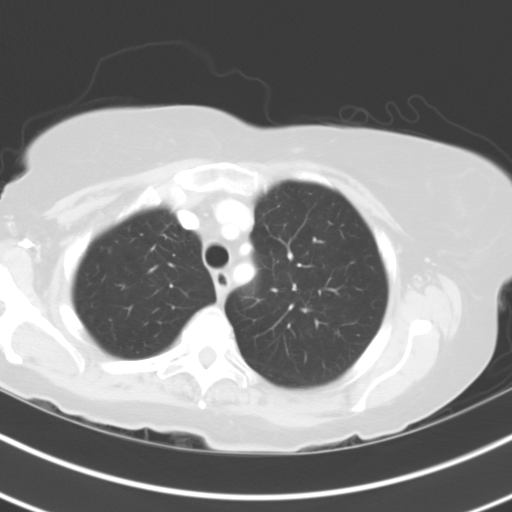
[im 52/61  lung]
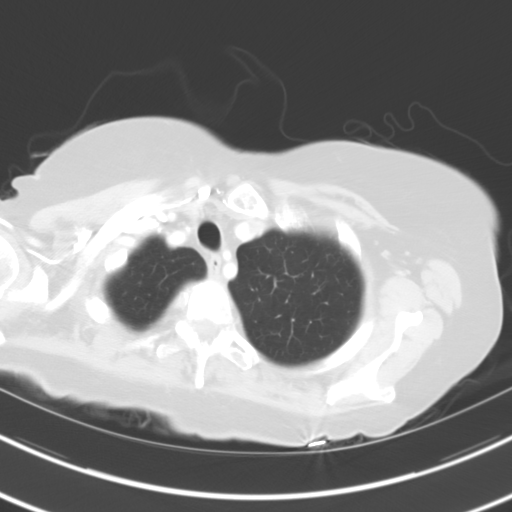
[im 56/61  lung]
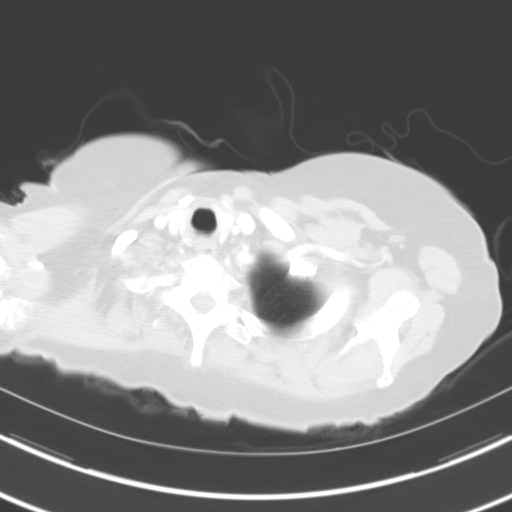

[Series 5: cor routine chest with · coronal · 0.59mm/px · 3 of 116 slices shown]
[im 24/116  lung]
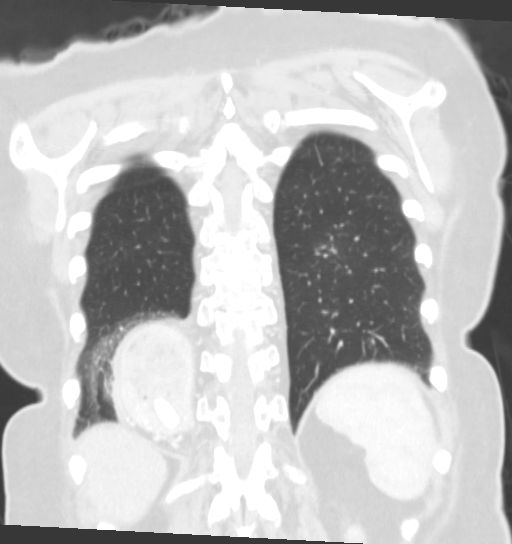
[im 47/116  lung]
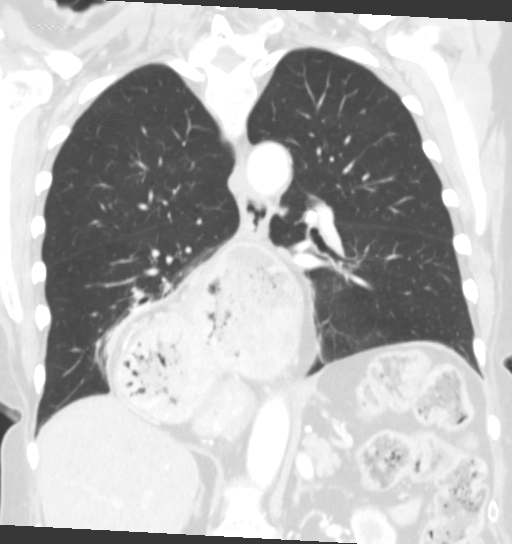
[im 70/116  lung]
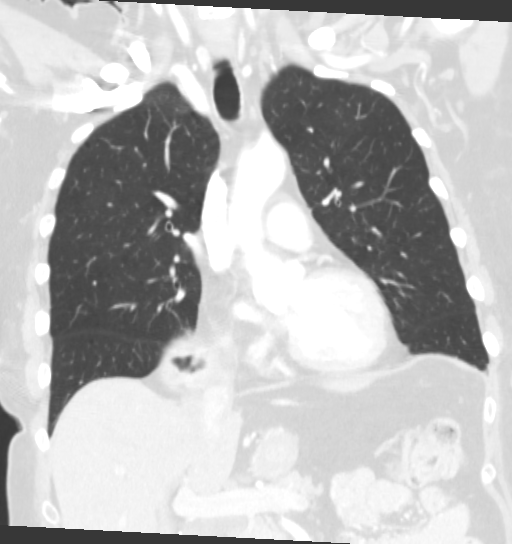

[15 of 36 positions shown; findings below may reference images not displayed]

FINDINGS: Mediastinum/Lymph Nodes: No masses or pathologically enlarged lymph
nodes identified. A large hiatal hernia is seen, with near complete
intrathoracic location of the stomach. No evidence of gastric
dilatation or obstruction.

Lungs/Pleura: No pulmonary infiltrate or mass identified. Mild
compressive atelectasis or scarring is seen in the right lower lobe
adjacent to the hiatal hernia. No effusion present.

Musculoskeletal/Soft Tissues: No suspicious bone lesions identified.
A 1.7 cm well-circumscribed asymmetric soft tissue nodule is seen in
lower outer quadrant of the left breast which is nonspecific.
Differential diagnosis includes fibroadenoma and carcinoma.

Upper Abdomen:  Unremarkable.
IMPRESSION: Large hiatal hernia, with near complete intrathoracic position of
stomach.

1.7 cm asymmetric soft tissue nodule in lower outer quadrant of left
breast, which is nonspecific. Differential diagnosis includes
fibroadenoma in carcinoma. Diagnostic mammography recommended for
further evaluation.

These results will be called to the ordering clinician or
representative by the Radiologist Assistant, and communication
documented in the PACS or zVision Dashboard.

## 2016-06-14 ENCOUNTER — Other Ambulatory Visit: Payer: Self-pay | Admitting: Internal Medicine

## 2016-06-14 DIAGNOSIS — Z1231 Encounter for screening mammogram for malignant neoplasm of breast: Secondary | ICD-10-CM

## 2016-07-16 ENCOUNTER — Ambulatory Visit
Admission: RE | Admit: 2016-07-16 | Discharge: 2016-07-16 | Disposition: A | Discharge status: Home / Self Care | Payer: Medicare Other | Source: Ambulatory Visit | Attending: Internal Medicine | Admitting: Internal Medicine

## 2016-07-16 DIAGNOSIS — Z1231 Encounter for screening mammogram for malignant neoplasm of breast: Secondary | ICD-10-CM | POA: Diagnosis present

## 2016-10-11 ENCOUNTER — Ambulatory Visit
Admission: RE | Admit: 2016-10-11 | Discharge: 2016-10-11 | Disposition: A | Discharge status: Home / Self Care | Payer: Medicare Other | Source: Ambulatory Visit | Attending: Gastroenterology | Admitting: Gastroenterology

## 2016-10-11 ENCOUNTER — Ambulatory Visit: Payer: Medicare Other | Admitting: Anesthesiology

## 2016-10-11 ENCOUNTER — Encounter: Payer: Self-pay | Admitting: Gastroenterology

## 2016-10-11 ENCOUNTER — Encounter
Admission: RE | Disposition: A | Discharge status: Home / Self Care | Payer: Self-pay | Source: Ambulatory Visit | Attending: Gastroenterology

## 2016-10-11 DIAGNOSIS — Z885 Allergy status to narcotic agent status: Secondary | ICD-10-CM | POA: Diagnosis not present

## 2016-10-11 DIAGNOSIS — E785 Hyperlipidemia, unspecified: Secondary | ICD-10-CM | POA: Diagnosis not present

## 2016-10-11 DIAGNOSIS — Z881 Allergy status to other antibiotic agents status: Secondary | ICD-10-CM | POA: Insufficient documentation

## 2016-10-11 DIAGNOSIS — Z86010 Personal history of colonic polyps: Secondary | ICD-10-CM | POA: Insufficient documentation

## 2016-10-11 DIAGNOSIS — Z888 Allergy status to other drugs, medicaments and biological substances status: Secondary | ICD-10-CM | POA: Insufficient documentation

## 2016-10-11 DIAGNOSIS — Z79899 Other long term (current) drug therapy: Secondary | ICD-10-CM | POA: Insufficient documentation

## 2016-10-11 DIAGNOSIS — Z9889 Other specified postprocedural states: Secondary | ICD-10-CM | POA: Insufficient documentation

## 2016-10-11 DIAGNOSIS — K210 Gastro-esophageal reflux disease with esophagitis: Secondary | ICD-10-CM | POA: Diagnosis not present

## 2016-10-11 DIAGNOSIS — K2950 Unspecified chronic gastritis without bleeding: Secondary | ICD-10-CM | POA: Diagnosis not present

## 2016-10-11 DIAGNOSIS — J45909 Unspecified asthma, uncomplicated: Secondary | ICD-10-CM | POA: Diagnosis not present

## 2016-10-11 DIAGNOSIS — K2270 Barrett's esophagus without dysplasia: Secondary | ICD-10-CM | POA: Insufficient documentation

## 2016-10-11 DIAGNOSIS — I10 Essential (primary) hypertension: Secondary | ICD-10-CM | POA: Diagnosis not present

## 2016-10-11 DIAGNOSIS — Z7982 Long term (current) use of aspirin: Secondary | ICD-10-CM | POA: Diagnosis not present

## 2016-10-11 DIAGNOSIS — K3189 Other diseases of stomach and duodenum: Secondary | ICD-10-CM | POA: Insufficient documentation

## 2016-10-11 DIAGNOSIS — Z882 Allergy status to sulfonamides status: Secondary | ICD-10-CM | POA: Diagnosis not present

## 2016-10-11 DIAGNOSIS — Z91018 Allergy to other foods: Secondary | ICD-10-CM | POA: Diagnosis not present

## 2016-10-11 DIAGNOSIS — K580 Irritable bowel syndrome with diarrhea: Secondary | ICD-10-CM | POA: Diagnosis present

## 2016-10-11 DIAGNOSIS — K449 Diaphragmatic hernia without obstruction or gangrene: Secondary | ICD-10-CM | POA: Insufficient documentation

## 2016-10-11 DIAGNOSIS — Z7984 Long term (current) use of oral hypoglycemic drugs: Secondary | ICD-10-CM | POA: Insufficient documentation

## 2016-10-11 DIAGNOSIS — E119 Type 2 diabetes mellitus without complications: Secondary | ICD-10-CM | POA: Diagnosis not present

## 2016-10-11 HISTORY — DX: Nonspecific reaction to tuberculin skin test without active tuberculosis: R76.11

## 2016-10-11 HISTORY — DX: Barrett's esophagus without dysplasia: K22.70

## 2016-10-11 HISTORY — DX: Hyperlipidemia, unspecified: E78.5

## 2016-10-11 HISTORY — DX: Iron deficiency anemia, unspecified: D50.9

## 2016-10-11 HISTORY — DX: Polyp of colon: K63.5

## 2016-10-11 HISTORY — PX: COLONOSCOPY WITH PROPOFOL: SHX5780

## 2016-10-11 HISTORY — PX: ESOPHAGOGASTRODUODENOSCOPY (EGD) WITH PROPOFOL: SHX5813

## 2016-10-11 LAB — GLUCOSE, CAPILLARY: Glucose-Capillary: 110 mg/dL — ABNORMAL HIGH (ref 65–99)

## 2016-10-11 SURGERY — COLONOSCOPY WITH PROPOFOL
Anesthesia: General

## 2016-10-11 MED ORDER — SODIUM CHLORIDE 0.9 % IV SOLN
INTRAVENOUS | Status: DC
  Administered 2016-10-11: 09:00:00 via INTRAVENOUS

## 2016-10-11 MED ORDER — FENTANYL CITRATE (PF) 100 MCG/2ML IJ SOLN
INTRAMUSCULAR | Status: AC
  Filled 2016-10-11: qty 2

## 2016-10-11 MED ORDER — PROPOFOL 500 MG/50ML IV EMUL
INTRAVENOUS | Status: DC | PRN
Start: 2016-10-11 — End: 2016-10-11
  Administered 2016-10-11: 140 ug/kg/min via INTRAVENOUS

## 2016-10-11 MED ORDER — MIDAZOLAM HCL 2 MG/2ML IJ SOLN
INTRAMUSCULAR | Status: AC
  Filled 2016-10-11: qty 2

## 2016-10-11 MED ORDER — PROPOFOL 500 MG/50ML IV EMUL
INTRAVENOUS | Status: AC
  Filled 2016-10-11: qty 50

## 2016-10-11 MED ORDER — LIDOCAINE 2% (20 MG/ML) 5 ML SYRINGE
INTRAMUSCULAR | Status: DC | PRN
  Administered 2016-10-11: 40 mg via INTRAVENOUS

## 2016-10-11 MED ORDER — MIDAZOLAM HCL 5 MG/5ML IJ SOLN
INTRAMUSCULAR | Status: DC | PRN
Start: 2016-10-11 — End: 2016-10-11
  Administered 2016-10-11: 1 mg via INTRAVENOUS

## 2016-10-11 MED ORDER — PROPOFOL 10 MG/ML IV BOLUS
INTRAVENOUS | Status: DC | PRN
  Administered 2016-10-11: 100 mg via INTRAVENOUS

## 2016-10-11 MED ORDER — SODIUM CHLORIDE 0.9 % IV SOLN: INTRAVENOUS | Status: DC

## 2016-10-11 MED ORDER — PHENYLEPHRINE HCL 10 MG/ML IJ SOLN
INTRAMUSCULAR | Status: DC | PRN
  Administered 2016-10-11 (×4): 100 ug via INTRAVENOUS

## 2016-10-11 MED ORDER — EPHEDRINE SULFATE 50 MG/ML IJ SOLN
INTRAMUSCULAR | Status: DC | PRN
  Administered 2016-10-11: 5 mg via INTRAVENOUS

## 2016-10-11 MED ORDER — FENTANYL CITRATE (PF) 100 MCG/2ML IJ SOLN
INTRAMUSCULAR | Status: DC | PRN
  Administered 2016-10-11: 50 ug via INTRAVENOUS

## 2016-10-11 NOTE — Anesthesia Preprocedure Evaluation (Signed)
Anesthesia Evaluation  Patient identified by MRN, date of birth, ID band Patient awake    Reviewed: Allergy & Precautions, NPO status , Patient's Chart, lab work & pertinent test results  History of Anesthesia Complications Negative for: history of anesthetic complications  Airway Mallampati: II  TM Distance: >3 FB Neck ROM: Full    Dental  (+) Edentulous Upper, Missing   Pulmonary asthma , neg sleep apnea,    breath sounds clear to auscultation- rhonchi (-) wheezing      Cardiovascular hypertension, Pt. on medications (-) CAD and (-) Past MI  Rhythm:Regular Rate:Normal - Systolic murmurs and - Diastolic murmurs    Neuro/Psych negative neurological ROS  negative psych ROS   GI/Hepatic Neg liver ROS, hiatal hernia, GERD  ,  Endo/Other  diabetes, Type 2, Oral Hypoglycemic Agents  Renal/GU negative Renal ROS     Musculoskeletal  (+) Arthritis ,   Abdominal (+) - obese,   Peds  Hematology  (+) anemia ,   Anesthesia Other Findings Past Medical History: No date: Arthritis No date: Asthma No date: Barrett's esophagus No date: Colon polyp No date: Diabetes mellitus without complication (HCC) No date: GERD (gastroesophageal reflux disease) No date: Hiatal hernia No date: Hyperlipidemia No date: Hypertension No date: IBS (irritable bowel syndrome) No date: Iron deficiency anemia No date: Pancreatitis 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   Reproductive/Obstetrics                             Anesthesia Physical Anesthesia Plan  ASA: III  Anesthesia Plan: General   Post-op Pain Management:    Induction: Intravenous  PONV Risk Score and Plan: 2 and Propofol  Airway Management Planned: Natural Airway  Additional Equipment:   Intra-op Plan:   Post-operative Plan:   Informed Consent: I have reviewed the patients History and Physical, chart, labs and discussed the procedure including the risks,  benefits and alternatives for the proposed anesthesia with the patient or authorized representative who has indicated his/her understanding and acceptance.   Dental advisory given  Plan Discussed with: CRNA and Anesthesiologist  Anesthesia Plan Comments:         Anesthesia Quick Evaluation

## 2016-10-11 NOTE — Transfer of Care (Signed)
Immediate Anesthesia Transfer of Care Note  Patient: [name]  Procedure(s) Performed: Procedure(s): COLONOSCOPY WITH PROPOFOL (N/A) ESOPHAGOGASTRODUODENOSCOPY (EGD) WITH PROPOFOL (N/A)  Patient Location: PACU and Endoscopy Unit  Anesthesia Type:General  Level of Consciousness: sedated  Airway & Oxygen Therapy: Patient Spontanous Breathing and Patient connected to nasal cannula oxygen  Post-op Assessment: Report given to RN and Post -op Vital signs reviewed and stable  Post vital signs: Reviewed and stable  Last Vitals:  Vitals:   10/11/16 0830 10/11/16 1102  BP: 101/84 (!) 115/56  Pulse: 65 73  Resp: 16 15  Temp: (!) 36 C (!) 36.1 C    Last Pain:  Vitals:   10/11/16 1102  TempSrc: Tympanic         Complications: No apparent anesthesia complications

## 2016-10-11 NOTE — Anesthesia Postprocedure Evaluation (Signed)
Anesthesia Post Note  Patient: Lynann Bolognaatricia L Thornsberry  Procedure(s) Performed: Procedure(s) (LRB): COLONOSCOPY WITH PROPOFOL (N/A) ESOPHAGOGASTRODUODENOSCOPY (EGD) WITH PROPOFOL (N/A)  Patient location during evaluation: Endoscopy Anesthesia Type: General Level of consciousness: awake and alert and oriented Pain management: pain level controlled Vital Signs Assessment: post-procedure vital signs reviewed and stable Respiratory status: spontaneous breathing, nonlabored ventilation and respiratory function stable Cardiovascular status: blood pressure returned to baseline and stable Postop Assessment: no signs of nausea or vomiting Anesthetic complications: no     Last Vitals:  Vitals:   10/11/16 1122 10/11/16 1132  BP: (!) 106/52 (!) 119/58  Pulse: 67 67  Resp: (!) 7 15  Temp:      Last Pain:  Vitals:   10/11/16 1102  TempSrc: Tympanic                 Cleo Santucci

## 2016-10-11 NOTE — Anesthesia Post-op Follow-up Note (Cosign Needed)
Anesthesia QCDR form completed.        

## 2016-10-11 NOTE — Op Note (Addendum)
[facility] Gastroenterology Patient Name: Lakendria Nicastro Procedure Date: 10/11/2016 9:38 AM MRN: [id number] Account #: [id number] Date of Birth: October 29, 1940 Admit Type: Outpatient Age: 76 Room: [facility] ENDO ROOM 3 Gender: Female Note Status: Finalized Procedure:            Colonoscopy Indications:          Chronic diarrhea, Personal history of colonic polyps Providers:            Christena Deem, MD Referring MD:         Danella Penton, MD (Referring MD) Medicines:            Monitored Anesthesia Care Complications:        No immediate complications. Procedure:            Pre-Anesthesia Assessment:                       - ASA Grade Assessment: III - A patient with severe                        systemic disease.                       After obtaining informed consent, the colonoscope was                        passed under direct vision. Throughout the procedure,                        the patient's blood pressure, pulse, and oxygen                        saturations were monitored continuously. The                        Colonoscope was introduced through the anus and                        advanced to the the cecum, identified by appendiceal                        orifice and ileocecal valve. The colonoscopy was                        unusually difficult due to significant looping and a                        tortuous colon. Successful completion of the procedure                        was aided by changing the patient to a supine position,                        changing the patient to a prone position and using                        manual pressure. The patient tolerated the procedure                        well. The quality of the bowel preparation was fair. Findings:      Two  sessile polyps were found in the hepatic flexure. The polyps were 3       to 4 mm in size. These polyps were removed with a cold biopsy forceps.       Resection and retrieval were  complete.      A 3 mm polyp was found in the ascending colon. The polyp was flat. The       polyp was removed with a cold biopsy forceps. Resection and retrieval       were complete.      A 1 mm polyp was found in the transverse colon. The polyp was sessile.       The polyp was removed with a cold biopsy forceps. Resection and       retrieval were complete.      Biopsies for histology were taken with a cold forceps from the right       colon and left colon for evaluation of microscopic colitis.      The digital rectal exam was normal.      Multiple small and large-mouthed diverticula were found in the sigmoid       colon and descending colon. Impression:           - Preparation of the colon was fair.                       - Two 3 to 4 mm polyps at the hepatic flexure, removed                        with a cold biopsy forceps. Resected and retrieved.                       - One 3 mm polyp in the ascending colon, removed with a                        cold biopsy forceps. Resected and retrieved.                       - One 1 mm polyp in the transverse colon, removed with                        a cold biopsy forceps. Resected and retrieved.                       - Biopsies were taken with a cold forceps from the                        right colon and left colon for evaluation of                        microscopic colitis. Recommendation:       - Await pathology results.                       - trial immodium 2 mg po once daily and one tablespoon                        citrucel daily                       - Return to GI clinic in 1 month. Procedure Code(s):    ---  Professional ---                       [id number], Colonoscopy, flexible; with biopsy, single or                        multiple Diagnosis Code(s):    --- Professional ---                       D12.3, Benign neoplasm of transverse colon (hepatic                        flexure or splenic flexure)                       D12.2, Benign  neoplasm of ascending colon                       K52.9, Noninfective gastroenteritis and colitis,                        unspecified                       Z86.010, Personal history of colonic polyps CPT copyright 2016 [employer]. All rights reserved. The codes documented in this report are preliminary and upon coder review may  be revised to meet current compliance requirements. Christena DeemMartin U Skulskie, MD 10/11/2016 11:06:57 AM This report has been signed electronically. Number of Addenda: 0 Note Initiated On: 10/11/2016 9:38 AM Scope Withdrawal Time: 0 hours 16 minutes 41 seconds  Total Procedure Duration: 0 hours 37 minutes 25 seconds       [facility]

## 2016-10-11 NOTE — H&P (Signed)
Outpatient short stay form Pre-procedure 10/11/2016 9:35 AM Mckenzie DeemMartin U Teka Chanda MD  Primary Physician: Dr. Bethann PunchesMark Miller  Reason for visit:  EGD and colonoscopy  History of present illness:  Patient is a 76 year old female known history of Barrett's esophagus and adenomatous colon polyps. She is presenting today for follow-up in this regard. She also however has had a fundoplication for a large intrathoracic hiatal hernia/stomach about 2 years ago. She has had some increased issues with loose stools and fecal urgency/incontinence since that time. However she no longer has any issues with emesis. She tolerated her prep well. She does take an 81 mg aspirin daily. She takes no other aspirin products or blood thinning agents.    Current Facility-Administered Medications:  .  0.9 %  sodium chloride infusion, , Intravenous, Continuous, Mckenzie DeemSkulskie, [name], MD .  0.9 %  sodium chloride infusion, , Intravenous, Continuous, Mckenzie DeemSkulskie, [name], MD  Prescriptions Prior to Admission  Medication Sig Dispense Refill Last Dose  . acetaminophen (TYLENOL) 325 MG tablet Take 2 tablets (650 mg total) by mouth every 4 (four) hours as needed for mild pain (or Temp > 100). 30 tablet 1 Past Month at Unknown time  . aspirin 81 MG chewable tablet Chew 1 tablet by mouth at bedtime.    10/10/2016 at Unknown time  . calcium citrate-vitamin D (CITRACAL+D) 315-200 MG-UNIT per tablet Take 1 tablet by mouth daily.    Past Week at Unknown time  . diphenoxylate-atropine (LOMOTIL) 2.5-0.025 MG tablet Take 1 tablet by mouth 2 (two) times daily as needed for diarrhea or loose stools.   Past Week at Unknown time  . fenofibrate (TRICOR) 145 MG tablet Take 1 tablet by mouth daily.   Past Week at Unknown time  . FERROUS FUMARATE-VITAMIN C PO Take by mouth 2 (two) times a week.   Past Week at Unknown time  . glimepiride (AMARYL) 4 MG tablet Take 0.5 tablets by mouth 2 (two) times daily.    Past Week at Unknown time  . lisinopril  (PRINIVIL,ZESTRIL) 5 MG tablet Take 1 tablet by mouth daily.   10/10/2016 at Unknown time  . magnesium gluconate (MAGONATE) 500 MG tablet Take 500 mg by mouth at bedtime.   Past Week at Unknown time  . metFORMIN (GLUCOPHAGE) 500 MG tablet Take 2 tablets by mouth 2 (two) times daily with a meal.   Past Week at Unknown time  . metoprolol succinate (TOPROL-XL) 100 MG 24 hr tablet Take 1 tablet by mouth daily.   10/11/2016 at 0500  . montelukast (SINGULAIR) 10 MG tablet Take 1 tablet by mouth every morning.    10/10/2016 at Unknown time  . Multiple Vitamin (MULTI-VITAMINS) TABS Take 1 tablet by mouth daily.   Past Week at Unknown time  . omeprazole (PRILOSEC) 20 MG capsule Take 1 capsule by mouth daily.   10/11/2016 at 0500  . potassium chloride SA (K-DUR,KLOR-CON) 20 MEQ tablet Take 1 tablet by mouth daily.   10/10/2016 at Unknown time  . triamterene-hydrochlorothiazide (MAXZIDE-25) 37.5-25 MG per tablet Take 1 tablet by mouth daily.   10/10/2016 at Unknown time  . albuterol (PROVENTIL HFA;VENTOLIN HFA) 108 (90 BASE) MCG/ACT inhaler Inhale 2 puffs into the lungs every 4 (four) hours as needed.   11/04/2014 at Unknown time  . atorvastatin (LIPITOR) 40 MG tablet Take 1 tablet by mouth at bedtime.    11/03/2014 at Unknown time  . fluticasone-salmeterol (ADVAIR HFA) 115-21 MCG/ACT inhaler Inhale 2 puffs into the lungs every 12 (twelve) hours.  11/04/2014 at Unknown time     Allergies  Allergen Reactions  . Mobic [Meloxicam] Hives and Shortness Of Breath  . Augmentin [Amoxicillin-Pot Clavulanate] Diarrhea and Swelling  . Codeine Swelling  . Demerol [Meperidine] Swelling  . Levaquin [Levofloxacin In D5w] Diarrhea and Swelling  . Morphine And Related Swelling  . Other Swelling    Raisins   . Sulfa Antibiotics Swelling  . Wine [Alcohol] Swelling     Past Medical History:  Diagnosis Date  . Arthritis   . Asthma   . Barrett's esophagus   . Colon polyp   . Diabetes mellitus without complication (HCC)   .  GERD (gastroesophageal reflux disease)   . Hiatal hernia   . Hyperlipidemia   . Hypertension   . IBS (irritable bowel syndrome)   . Iron deficiency anemia   . Pancreatitis   . PPD positive     Review of systems:      Physical Exam    Heart and lungs: Regular rate and rhythm without rub or gallop, lungs are bilaterally clear.    HEENT: Normocephalic atraumatic eyes are anicteric    Other:     Pertinant exam for procedure: Soft nontender nondistended bowel sounds positive normoactive    Planned proceedures: EGD, colonoscopy and indicated procedures. I have discussed the risks benefits and complications of procedures to include not limited to bleeding, infection, perforation and the risk of sedation and the patient wishes to proceed.    Mckenzie Deem, MD Gastroenterology 10/11/2016  9:35 AM

## 2016-10-11 NOTE — Op Note (Signed)
[name] Gastroenterology Patient Name: [name] Procedure Date: 10/11/2016 9:30 AM MRN: [id number] Account #: [id number] Date of Birth: October 26, 1940 Admit Type: Outpatient Age: 76 Room: [facility] ENDO ROOM 3 Gender: Female Note Status: Finalized Procedure:            Upper GI endoscopy Indications:          Follow-up of Barrett's esophagus Providers:            Christena Deem, MD Referring MD:         Danella Penton, MD (Referring MD) Medicines:            Monitored Anesthesia Care Complications:        No immediate complications. Procedure:            Pre-Anesthesia Assessment:                       - ASA Grade Assessment: III - A patient with severe                        systemic disease.                       After obtaining informed consent, the endoscope was                        passed under direct vision. Throughout the procedure,                        the patient's blood pressure, pulse, and oxygen                        saturations were monitored continuously. The Endoscope                        was introduced through the mouth, and advanced to the                        third part of duodenum. The upper GI endoscopy was                        accomplished without difficulty. The patient tolerated                        the procedure well. Findings:      There were esophageal mucosal changes consistent with short-segment       Barrett's esophagus present at the gastroesophageal junction. The       maximum longitudinal extent of these mucosal changes was 1 cm in length.       Mucosa was biopsied with a cold forceps for histology in 4 quadrants.      Localized mild mucosal variance characterized by altered texture was       found in the prepyloric region of the stomach. Biopsies were taken with       a cold forceps for histology.      A small hiatal hernia was present.      Evidence of a Nissen fundoplication was found in the gastric  fundus. The       wrap appeared loose, some mild escape of insufflated air.      The examined duodenum was normal. Biopsies were taken with a cold  forceps for histology.      The cardia and gastric fundus were normal on retroflexion otherwise. Impression:           - Esophageal mucosal changes consistent with                        short-segment Barrett's esophagus. Biopsied.                       - Gastric mucosal variant. Biopsied.                       - Small hiatal hernia.                       - A Nissen fundoplication was found. The wrap appears                        loose.                       - Normal examined duodenum. Biopsied. Recommendation:       - Continue present medications.                       - Await pathology results.                       - Telephone GI clinic for pathology results in 1 week. Procedure Code(s):    --- Professional ---                       [phone], Esophagogastroduodenoscopy, flexible, transoral;                        with biopsy, single or multiple Diagnosis Code(s):    --- Professional ---                       K22.70, Barrett's esophagus without dysplasia                       K31.89, Other diseases of stomach and duodenum                       K44.9, Diaphragmatic hernia without obstruction or                        gangrene                       Z98.890, Other specified postprocedural states CPT copyright 2016 [employer]. All rights reserved. The codes documented in this report are preliminary and upon coder review may  be revised to meet current compliance requirements. Christena DeemMartin U Skulskie, MD 10/11/2016 10:16:09 AM This report has been signed electronically. Number of Addenda: 0 Note Initiated On: 10/11/2016 9:30 AM      [facility]

## 2016-10-12 LAB — SURGICAL PATHOLOGY

## 2016-12-24 NOTE — H&P (Signed)
Progress Notes Encounter Date: 11/08/2016   Phillips, Mckenzie Sous, MD  Obstetrics and Gynecology    [] Hide copied text [] Hover for attribution information  Chief Complaint:    [name] is a 76 y.o. female here for tvh and anterior colporrhaphy , possble rectocele repair. Consult from [name] for vaginal pressure and fullness for the past one yr . ++ urgency and at times difficulty with emptying  No splintnig with BM . No vaginal bleeding   s/p uterine suspension and ? Anterior colporrhaphy age 55 .  Not sexually active and she denies a chronic cough or tobacco hx  G4P2 with 2 svd  Past Medical History:  has a past medical history of Asthma without status asthmaticus, unspecified; Barrett's esophagus (06/07/2014); Colon polyp; Diabetes mellitus type 2, uncomplicated (CMS-HCC); History of positive PPD; Hyperlipidemia, unspecified; Hypertension; Iron deficiency anemia, unspecified; Serrated adenoma of colon, unspecified (10/11/2016); and Tubular adenoma of colon, unspecified (10/11/2016).  Past Surgical History:  has a past surgical history that includes BSO (09/2012); Laparoscopic cholecystectomy (2006); Pelvic lap (1969); Colonoscopy (08/09/2008); egd (08/09/2008); Colonoscopy (06/07/2014); egd (06/07/2014); Colonoscopy (10/11/2016); Upper gastrointestinal endoscopy; nissan fundoplication (7/16); egd (10/11/2016); and Uterine suspension. Family History: family history includes Alzheimer's disease in her sister; Breast cancer in her sister; Diabetes type II in her brother, mother, and sister; High blood pressure (Hypertension) in her mother and sister; Mental illness in her mother; Myocardial Infarction (Heart attack) in her mother; No Known Problems in her father; Stroke in her mother. Social History:  reports that she has never smoked. She has never used smokeless tobacco. She reports that she does not drink alcohol or use drugs. OB/GYN History:          OB History    Gravida Para  Term Preterm AB Living   4 2     2 2    SAB TAB Ectopic Molar Multiple Live Births             2      Allergies: is allergic to augmentin [amoxicillin-pot clavulanate]; codeine; demerol [meperidine]; levaquin [levofloxacin]; mobic [meloxicam]; morphine; and sulfa (sulfonamide antibiotics). Medications:  Current Outpatient Prescriptions:  .  albuterol (PROAIR HFA) 90 mcg/actuation inhaler, Inhale 2 inhalations into the lungs every 4 (four) hours as needed for Wheezing., Disp: 1 Inhaler, Rfl: 12 .  aspirin 81 MG chewable tablet, Take 81 mg by mouth once daily., Disp: , Rfl:  .  atorvastatin (LIPITOR) 40 MG tablet, Take 1 tablet (40 mg total) by mouth once daily., Disp: 30 tablet, Rfl: 11 .  calcium citrate-vitamin D3 (CITRACAL+D) 315-200 mg-unit tablet, Take 2 tablets by mouth once daily., Disp: , Rfl:  .  diphenoxylate-atropine (LOMOTIL) 2.5-0.025 mg tablet, Take 1 tablet by mouth 2 (two) times daily as needed for Diarrhea., Disp: , Rfl:  .  ferrous fumarate-vitamin C (VITRON-C) 65 mg iron- 125 mg tablet, Take 1 tablet by mouth twice a week., Disp: , Rfl:  .  glimepiride (AMARYL) 4 MG tablet, Take 1 tablet (4 mg total) by mouth daily with breakfast., Disp: 30 tablet, Rfl: 11 .  lisinopril (PRINIVIL,ZESTRIL) 5 MG tablet, Take 1 tablet (5 mg total) by mouth once daily., Disp: 30 tablet, Rfl: 11 .  loperamide (IMODIUM A-D) 2 mg tablet, Take 1 tablet (2 mg total) by mouth once daily. Hold if constipation, Disp: 30 tablet, Rfl: 3 .  metFORMIN (GLUCOPHAGE) 500 MG tablet, TAKE TWO (2) TABLETS BY MOUTH 2 TIMES DAILY WITH MEALS, Disp: 120 tablet, Rfl: 11 .  methylcellulose (CITRUCEL) oral powder, Take 19 g by mouth once daily. Mix with a full glass (240 mL) of fluid., Disp: 570 g, Rfl: 2 .  metoprolol succinate (TOPROL-XL) 100 MG XL tablet, Take 1 tablet (100 mg total) by mouth once daily., Disp: 30 tablet, Rfl: 11 .  multivitamin tablet, Take 1 tablet by mouth once daily., Disp: , Rfl:  .   omeprazole (PRILOSEC) 20 MG Mckenzie capsule, Take 1 capsule (20 mg total) by mouth once daily., Disp: 30 capsule, Rfl: 11 .  potassium chloride (K-DUR,KLOR-CON) 20 MEQ ER tablet, Take 1 tablet (20 mEq total) by mouth once daily., Disp: 30 tablet, Rfl: 11 .  triamterene-hydrochlorothiazide (MAXZIDE-25) 37.5-25 mg tablet, Take 1 tablet by mouth once daily., Disp: 30 tablet, Rfl: 11  Review of Systems: General:                      No fatigue or weight loss Eyes:                           No vision changes Ears:                            No hearing difficulty Respiratory:                No cough or shortness of breath Pulmonary:                  No asthma or shortness of breath Cardiovascular:           No chest pain, palpitations, dyspnea on exertion Gastrointestinal:          No abdominal bloating, chronic diarrhea, constipations, masses, pain or hematochezia Genitourinary:             No hematuria, dysuria, abnormal vaginal discharge, pelvic pain, Menometrorrhagia + pelvic organ prolapse  Lymphatic:                   No swollen lymph nodes Musculoskeletal:         No muscle weakness Neurologic:                  No extremity weakness, syncope, seizure disorder Psychiatric:                  No history of depression, delusions or suicidal/homicidal ideation    Exam:      Vitals:   12/09/16 1356  BP: 113/76  Pulse: 76    Body mass index is 22.3 kg/m.  WDWN white/ female in NAD   Lungs: CTA  CV : RRR without murmur   Breast: exam done in sitting and lying position : No dimpling or retraction, no dominant mass, no spontaneous discharge, no axillary adenopathy Neck:  no thyromegaly Abdomen: soft , no mass, normal active bowel sounds,  non-tender, no rebound tenderness Pelvic: tanner stage 5 ,  External genitalia: vulva /labia no lesions Urethra: no prolapse Vagina: normal physiologic d/c, third degree cystocele with valsalva, first degree rectocele with valsalva  Cervix: no  lesions, no cervical motion tenderness   Uterus: normal size shape and contour, non-tender 2-3 degree uterine descensus with valsalva  Adnexa: no mass,  non-tender   Rectovaginal:  Impression:   The primary encounter diagnosis was Cystocele with uterine descensus. A diagnosis of OAB (overactive bladder) was also pertinent to this visit.    Plan:   I  have spoken with the patient regarding treatment options including expectant management, pessary use or surgical intervention. After a full discussion the pt elects to proceed with TVh and anterior colporrhaphy , possible rectocele repair  Start estrogen vaginal cream 1/4 applicator daily x 2weeks then 3x/ week until surgery  Trial of Detrol 2 mg LA for OAB    Mckenzie Hyacinth Meeker preop clearance as below :    Mckenzie Purl, MD  Internal Medicine    [] Hide copied text [] Hover for attribution information Kissie Ziolkowski is a 76 y.o. female  CHIEF COMPLAINT:    Chief Complaint  Patient presents with  . clearance for surgery     SUBJECTIVE: Patient reports that her blood sugars doing well, no more diarrhea with the Citrucel.  She has had progressive gynecologic problems with a lot of pressure in the vaginal vault area.  September 10 planned for hysterectomy and bladder repair.  Patient without angina or anginal equivalents, minimal dyspnea on exertion. ______________________________________________________________________ Current Outpatient Prescriptions: albuterol (PROAIR HFA) 90 mcg/actuation inhaler, Inhale 2 inhalations into the lungs every 4 (four) hours as needed for Wheezing., PRN Not Currently Taking aspirin 81 MG chewable tablet, Take 81 mg by mouth once daily., Taking atorvastatin (LIPITOR) 40 MG tablet, Take 1 tablet (40 mg total) by mouth once daily., Taking calcium citrate-vitamin D3 (CITRACAL+D) 315-200 mg-unit tablet, Take 2 tablets by mouth once daily., Taking Compound Medication, Estriol 1mg /g Use 1/4 app  per vagina daily x 2 weeks then use 3 x weekly until surgery Disp 30 g tube with 2 rf Called to [facility], Taking ferrous fumarate-vitamin C (VITRON-C) 65 mg iron- 125 mg tablet, Take 1 tablet by mouth twice a week., Taking glimepiride (AMARYL) 4 MG tablet, Take 1 tablet (4 mg total) by mouth daily with breakfast., Taking lisinopril (PRINIVIL,ZESTRIL) 5 MG tablet, Take 1 tablet (5 mg total) by mouth once daily., Taking loperamide (IMODIUM A-D) 2 mg tablet, Take 1 tablet (2 mg total) by mouth once daily. Hold if constipation, Taking metFORMIN (GLUCOPHAGE) 500 MG tablet, TAKE TWO (2) TABLETS BY MOUTH 2 TIMES DAILY WITH MEALS, Taking methylcellulose (CITRUCEL) oral powder, Take 19 g by mouth once daily. Mix with a full glass (240 mL) of fluid., Taking metoprolol succinate (TOPROL-XL) 100 MG XL tablet, Take 1 tablet (100 mg total) by mouth once daily., Taking multivitamin tablet, Take 1 tablet by mouth once daily., Taking omeprazole (PRILOSEC) 20 MG Mckenzie capsule, Take 1 capsule (20 mg total) by mouth once daily., Taking peppermint oil (IBGARD ORAL), Take by mouth., Taking potassium chloride (K-DUR,KLOR-CON) 20 MEQ ER tablet, Take 1 tablet (20 mEq total) by mouth once daily., Taking triamterene-hydrochlorothiazide (MAXZIDE-25) 37.5-25 mg tablet, Take 1 tablet by mouth once daily., Taking oxybutynin (DITROPAN) 5 mg tablet, Take 1 tablet (5 mg total) by mouth 3 (three) times daily. (Patient not taking: Reported on 12/13/2016 ), Not Taking  ALLERGIES: Augmentin [amoxicillin-pot clavulanate]; Codeine; Demerol [meperidine]; Levaquin [levofloxacin]; Mobic [meloxicam]; Morphine; and Sulfa (sulfonamide antibiotics)      Past Medical History:  Diagnosis Date  . Asthma without status asthmaticus, unspecified    mild intermittent  . Barrett's esophagus 06/07/2014  . Colon polyp   . Diabetes mellitus type 2, uncomplicated (CMS-HCC)   . History of positive PPD   . Hyperlipidemia, unspecified   .  Hypertension   . Iron deficiency anemia, unspecified   . Serrated adenoma of colon, unspecified 10/11/2016  . Tubular adenoma of colon, unspecified 10/11/2016         Past  Surgical History:  Procedure Laterality Date  . BSO  09/2012   cystadenoma, benign  . COLONOSCOPY  08/09/2008  . COLONOSCOPY  06/07/2014   Sessile serrated adenoma and tubular adenoma/Repeat 66yrs/MUS     . COLONOSCOPY  10/11/2016   Serrated adenoma of colon/Tubular adenoma/Repeat 73yrs/MUS  . EGD  08/09/2008   Barrett's 3 attempts made  . EGD  06/07/2014   Barrett's Esophagus/Repeat 35yrs/MUS  . EGD  10/11/2016   GERD/Chronic gastritis/Repeat 86yrs/MUS  . LAPAROSCOPIC CHOLECYSTECTOMY  2006  . nissan fundoplication  7/16  . Pelvic lap  1969   uterine suspension, appendectomy, excision endometrial implants  . UPPER GASTROINTESTINAL ENDOSCOPY    . UTERINE SUSPENSION     with ant repair,age 58    PHYSICAL EXAM:           BP 118/66 (BP Location: Left upper arm, Patient Position: Sitting, BP Cuff Size: Small Adult)   Pulse 73   Wt 54.7 kg (120 lb 9.6 oz)   SpO2 97%   BMI 22.79 kg/m  Body mass index is 22.79 kg/m.     Wt Readings from Last 3 Encounters:  12/13/16 54.7 kg (120 lb 9.6 oz)  11/20/16 54 kg (119 lb)  11/08/16 53.5 kg (118 lb)                  BP Readings from Last 3 Encounters:  12/13/16 118/66  11/20/16 127/69  11/08/16 113/76    Constitutional:NAD Neck: supple, no thyromegaly, good ROM Respiratory:clear to auscultation, no rales or wheezes Cardiovascular:RRR, no murmur or gallop    ASSESSMENT/PLAN:   Severe cystocele-going for surgery, low surgical risk, EKG shows normal sinus rhythm without ischemic changes Diabetes-overall excellent control, weight stable, maintaining twice daily metformin Diarrhea-resolved with Citrucel  No Follow-up on file.        Electronically signed by Mckenzie Purl, MD at 12/13/2016 5:03 PM      Return if  symptoms worsen or fail to improve, for pre.  Vilma Prader, MD

## 2016-12-27 ENCOUNTER — Encounter
Admission: RE | Admit: 2016-12-27 | Discharge: 2016-12-27 | Disposition: A | Discharge status: Home / Self Care | Payer: Medicare Other | Source: Ambulatory Visit | Attending: Obstetrics and Gynecology | Admitting: Obstetrics and Gynecology

## 2016-12-27 DIAGNOSIS — Z01818 Encounter for other preprocedural examination: Secondary | ICD-10-CM | POA: Diagnosis not present

## 2016-12-27 DIAGNOSIS — N8110 Cystocele, unspecified: Secondary | ICD-10-CM | POA: Diagnosis not present

## 2016-12-27 DIAGNOSIS — N3281 Overactive bladder: Secondary | ICD-10-CM | POA: Insufficient documentation

## 2016-12-27 HISTORY — DX: Nausea with vomiting, unspecified: R11.2

## 2016-12-27 HISTORY — DX: Other specified postprocedural states: Z98.890

## 2016-12-27 LAB — BASIC METABOLIC PANEL
Anion gap: 8 (ref 5–15)
BUN: 17 mg/dL (ref 6–20)
CHLORIDE: 101 mmol/L (ref 101–111)
CO2: 29 mmol/L (ref 22–32)
Calcium: 9.3 mg/dL (ref 8.9–10.3)
Creatinine, Ser: 0.53 mg/dL (ref 0.44–1.00)
GFR calc non Af Amer: >60 mL/min (ref >60)
Glucose, Bld: 170 mg/dL — ABNORMAL HIGH (ref 65–99)
POTASSIUM: 4 mmol/L (ref 3.5–5.1)
Sodium: 138 mmol/L (ref 135–145)

## 2016-12-27 LAB — CBC
HCT: 37.1 % (ref 35.0–47.0)
HEMOGLOBIN: 12.4 g/dL (ref 12.0–16.0)
MCH: 29.2 pg (ref 26.0–34.0)
MCHC: 33.4 g/dL (ref 32.0–36.0)
MCV: 87.4 fL (ref 80.0–100.0)
Platelets: 177 10*3/uL (ref 150–440)
RBC: 4.25 MIL/uL (ref 3.80–5.20)
RDW: 15.6 % — ABNORMAL HIGH (ref 11.5–14.5)
WBC: 5.5 10*3/uL (ref 3.6–11.0)

## 2016-12-27 LAB — TYPE AND SCREEN
ABO/RH(D): A NEG
Antibody Screen: NEGATIVE

## 2016-12-27 MED ORDER — FLEET ENEMA 7-19 GM/118ML RE ENEM
1.0000 | ENEMA | Freq: Once | RECTAL | Status: DC
  Filled 2016-12-27: qty 1

## 2016-12-27 NOTE — Patient Instructions (Signed)
  Your procedure is scheduled on: Mon. 01/07/17 Report to Day Surgery. To find out your arrival time please call [phone] between 1PM - 3PM on Friday 01/04/17.  Remember: Instructions that are not followed completely may result in serious medical risk, up to and including death, or upon the discretion of your surgeon and anesthesiologist your surgery may need to be rescheduled.    __x__ 1. Do not eat food after midnight the night before your procedure. No gum chewing or hard candies. You may drink clear liquids up to 2 hours before you are scheduled to arrive for your surgery- DO not drink clear liquids within 2 hours of the start of your surgery.  Clear Liquids include: water, apple juice without pulp, clear carbohydrate drink such as Clearfast of Gartorade, Black Coffee or Tea (Do not add anything to coffee or tea).    ____ 2. No Alcohol for 24 hours before or after surgery.   ____ 3. Do Not Smoke For 24 Hours Prior to Your Surgery.   ____ 4. Bring all medications with you on the day of surgery if instructed.    ___x_ 5. Notify your doctor if there is any change in your medical condition     (cold, fever, infections).       Do not wear jewelry, make-up, hairpins, clips or nail polish.  Do not wear lotions, powders, or perfumes. You may wear deodorant.  Do not shave 48 hours prior to surgery. Men may shave face and neck.  Do not bring valuables to the hospital.    [facility] belongings or valuables.               Contacts, dentures or bridgework may not be worn into surgery.  Leave your suitcase in the car. After surgery it may be brought to your room.  For patients admitted to the hospital, discharge time is determined by your                treatment team.   Patients discharged the day of surgery will not be allowed to drive home.   Please read over the following fact sheets that you were given:      __x__ Take these medicines the  morning of surgery with A SIP OF WATER:    1. acetaminophen (TYLENOL) 325 MG tablet  2. albuterol (PROVENTIL HFA;VENTOLIN HFA) 108 (90 BASE) MCG/ACT inhaler and bring with you  3. metoprolol succinate (TOPROL-XL) 100 MG 24 hr tablet  4.omeprazole (PRILOSEC) 20 MG capsule take extra dose the night before surgery  5.  6.  __x__ Fleet Enema (as directed)   __x__ Use CHG Soap as directed  __x__ Use inhalers on the day of surgery  __x__ Stop metformin 2 days prior to surgery 01/05/17    ____ Take 1/2 of usual insulin dose the night before surgery and none on the morning of surgery.   __x__ Stop aspirin on 01/02/17  ____ Stop Anti-inflammatories on    ____ Stop supplements until after surgery.    ____ Bring C-Pap to the hospital.

## 2017-01-06 MED ORDER — CEFOXITIN SODIUM-DEXTROSE 2-2.2 GM-% IV SOLR (PREMIX)
2.0000 g | INTRAVENOUS | Status: AC
  Administered 2017-01-07: 2 g via INTRAVENOUS

## 2017-01-07 ENCOUNTER — Inpatient Hospital Stay: Payer: Medicare Other | Admitting: Certified Registered Nurse Anesthetist

## 2017-01-07 ENCOUNTER — Encounter
Admission: RE | Disposition: A | Discharge status: Home / Self Care | Payer: Self-pay | Source: Ambulatory Visit | Attending: Obstetrics and Gynecology

## 2017-01-07 ENCOUNTER — Encounter: Payer: Self-pay | Admitting: *Deleted

## 2017-01-07 ENCOUNTER — Observation Stay
Admission: RE | Admit: 2017-01-07 | Discharge: 2017-01-08 | Disposition: A | Discharge status: Home / Self Care | Payer: Medicare Other | Source: Ambulatory Visit | Attending: Obstetrics and Gynecology | Admitting: Obstetrics and Gynecology

## 2017-01-07 DIAGNOSIS — Z79899 Other long term (current) drug therapy: Secondary | ICD-10-CM | POA: Insufficient documentation

## 2017-01-07 DIAGNOSIS — K219 Gastro-esophageal reflux disease without esophagitis: Secondary | ICD-10-CM | POA: Diagnosis not present

## 2017-01-07 DIAGNOSIS — Z7984 Long term (current) use of oral hypoglycemic drugs: Secondary | ICD-10-CM | POA: Diagnosis not present

## 2017-01-07 DIAGNOSIS — K449 Diaphragmatic hernia without obstruction or gangrene: Secondary | ICD-10-CM | POA: Insufficient documentation

## 2017-01-07 DIAGNOSIS — J45909 Unspecified asthma, uncomplicated: Secondary | ICD-10-CM | POA: Diagnosis not present

## 2017-01-07 DIAGNOSIS — Z7982 Long term (current) use of aspirin: Secondary | ICD-10-CM | POA: Diagnosis not present

## 2017-01-07 DIAGNOSIS — I10 Essential (primary) hypertension: Secondary | ICD-10-CM | POA: Insufficient documentation

## 2017-01-07 DIAGNOSIS — N8110 Cystocele, unspecified: Secondary | ICD-10-CM | POA: Diagnosis not present

## 2017-01-07 DIAGNOSIS — D649 Anemia, unspecified: Secondary | ICD-10-CM | POA: Insufficient documentation

## 2017-01-07 DIAGNOSIS — E119 Type 2 diabetes mellitus without complications: Secondary | ICD-10-CM | POA: Diagnosis not present

## 2017-01-07 DIAGNOSIS — Z9889 Other specified postprocedural states: Secondary | ICD-10-CM

## 2017-01-07 HISTORY — PX: VAGINAL HYSTERECTOMY: SHX2639

## 2017-01-07 HISTORY — PX: CYSTOCELE REPAIR: SHX163

## 2017-01-07 LAB — ABO/RH: ABO/RH(D): A NEG

## 2017-01-07 LAB — GLUCOSE, CAPILLARY
GLUCOSE-CAPILLARY: 145 mg/dL — AB (ref 65–99)
GLUCOSE-CAPILLARY: 137 mg/dL — AB (ref 65–99)
GLUCOSE-CAPILLARY: 307 mg/dL — AB (ref 65–99)
Glucose-Capillary: 224 mg/dL — ABNORMAL HIGH (ref 65–99)

## 2017-01-07 SURGERY — HYSTERECTOMY, VAGINAL
Anesthesia: General

## 2017-01-07 MED ORDER — SODIUM CHLORIDE 0.9 % IV SOLN
INTRAVENOUS | Status: DC
  Administered 2017-01-07: 10:00:00 via INTRAVENOUS

## 2017-01-07 MED ORDER — SUGAMMADEX SODIUM 200 MG/2ML IV SOLN
INTRAVENOUS | Status: DC | PRN
  Administered 2017-01-07: 125 mg via INTRAVENOUS

## 2017-01-07 MED ORDER — LIDOCAINE HCL (PF) 2 % IJ SOLN
INTRAMUSCULAR | Status: AC
  Filled 2017-01-07: qty 6

## 2017-01-07 MED ORDER — ROCURONIUM BROMIDE 100 MG/10ML IV SOLN
INTRAVENOUS | Status: DC | PRN
  Administered 2017-01-07 (×2): 5 mg via INTRAVENOUS
  Administered 2017-01-07: 10 mg via INTRAVENOUS
  Administered 2017-01-07: 30 mg via INTRAVENOUS

## 2017-01-07 MED ORDER — ACETAMINOPHEN 10 MG/ML IV SOLN
INTRAVENOUS | Status: AC
  Filled 2017-01-07: qty 100

## 2017-01-07 MED ORDER — KETOROLAC TROMETHAMINE 30 MG/ML IJ SOLN
INTRAMUSCULAR | Status: DC | PRN
  Administered 2017-01-07: 15 mg via INTRAVENOUS

## 2017-01-07 MED ORDER — METFORMIN HCL 500 MG PO TABS
1000.0000 mg | ORAL_TABLET | Freq: Two times a day (BID) | ORAL | Status: DC
  Administered 2017-01-07 – 2017-01-08 (×2): 1000 mg via ORAL
  Filled 2017-01-07 (×2): qty 2

## 2017-01-07 MED ORDER — ONDANSETRON HCL 4 MG/2ML IJ SOLN
INTRAMUSCULAR | Status: AC
  Filled 2017-01-07: qty 2

## 2017-01-07 MED ORDER — ONDANSETRON HCL 4 MG/2ML IJ SOLN
INTRAMUSCULAR | Status: DC | PRN
  Administered 2017-01-07: 4 mg via INTRAVENOUS

## 2017-01-07 MED ORDER — LIDOCAINE-EPINEPHRINE 1 %-1:100000 IJ SOLN
INTRAMUSCULAR | Status: AC
  Filled 2017-01-07: qty 2

## 2017-01-07 MED ORDER — DEXAMETHASONE SODIUM PHOSPHATE 10 MG/ML IJ SOLN
INTRAMUSCULAR | Status: DC | PRN
  Administered 2017-01-07: 10 mg via INTRAVENOUS

## 2017-01-07 MED ORDER — LIDOCAINE-EPINEPHRINE 1 %-1:100000 IJ SOLN
INTRAMUSCULAR | Status: DC | PRN
  Administered 2017-01-07: 15 mL

## 2017-01-07 MED ORDER — DEXAMETHASONE SODIUM PHOSPHATE 10 MG/ML IJ SOLN
INTRAMUSCULAR | Status: AC
  Filled 2017-01-07: qty 1

## 2017-01-07 MED ORDER — SUCCINYLCHOLINE CHLORIDE 20 MG/ML IJ SOLN
INTRAMUSCULAR | Status: AC
  Filled 2017-01-07: qty 1

## 2017-01-07 MED ORDER — FENTANYL CITRATE (PF) 100 MCG/2ML IJ SOLN
INTRAMUSCULAR | Status: AC
  Administered 2017-01-07: 25 ug via INTRAVENOUS
  Filled 2017-01-07: qty 2

## 2017-01-07 MED ORDER — PROPOFOL 10 MG/ML IV BOLUS
INTRAVENOUS | Status: AC
  Filled 2017-01-07: qty 20

## 2017-01-07 MED ORDER — LISINOPRIL 10 MG PO TABS
5.0000 mg | ORAL_TABLET | Freq: Every day | ORAL | Status: DC
  Administered 2017-01-07: 5 mg via ORAL
  Filled 2017-01-07: qty 1

## 2017-01-07 MED ORDER — FENTANYL CITRATE (PF) 100 MCG/2ML IJ SOLN
INTRAMUSCULAR | Status: DC | PRN
  Administered 2017-01-07 (×2): 50 ug via INTRAVENOUS

## 2017-01-07 MED ORDER — ACETAMINOPHEN 10 MG/ML IV SOLN
INTRAVENOUS | Status: DC | PRN
  Administered 2017-01-07: 1000 mg via INTRAVENOUS

## 2017-01-07 MED ORDER — HYDROMORPHONE HCL 1 MG/ML IJ SOLN
INTRAMUSCULAR | Status: DC | PRN
  Administered 2017-01-07: 0.5 mg via INTRAVENOUS

## 2017-01-07 MED ORDER — PROPOFOL 10 MG/ML IV BOLUS
INTRAVENOUS | Status: DC | PRN
  Administered 2017-01-07: 150 mg via INTRAVENOUS

## 2017-01-07 MED ORDER — ONDANSETRON HCL 4 MG/2ML IJ SOLN: 4.0000 mg | Freq: Once | INTRAMUSCULAR | Status: DC | PRN

## 2017-01-07 MED ORDER — ATORVASTATIN CALCIUM 20 MG PO TABS
40.0000 mg | ORAL_TABLET | Freq: Every day | ORAL | Status: DC
  Administered 2017-01-07: 40 mg via ORAL
  Filled 2017-01-07 (×2): qty 2

## 2017-01-07 MED ORDER — LIDOCAINE HCL (PF) 2 % IJ SOLN
INTRAMUSCULAR | Status: AC
  Filled 2017-01-07: qty 2

## 2017-01-07 MED ORDER — FENTANYL CITRATE (PF) 100 MCG/2ML IJ SOLN
25.0000 ug | INTRAMUSCULAR | Status: DC | PRN
  Administered 2017-01-07 (×4): 25 ug via INTRAVENOUS

## 2017-01-07 MED ORDER — MIDAZOLAM HCL 2 MG/2ML IJ SOLN
INTRAMUSCULAR | Status: AC
  Filled 2017-01-07: qty 2

## 2017-01-07 MED ORDER — KETOROLAC TROMETHAMINE 30 MG/ML IJ SOLN
15.0000 mg | Freq: Four times a day (QID) | INTRAMUSCULAR | Status: AC
  Administered 2017-01-07 – 2017-01-08 (×4): 15 mg via INTRAVENOUS
  Filled 2017-01-07 (×5): qty 1

## 2017-01-07 MED ORDER — ONDANSETRON HCL 4 MG/2ML IJ SOLN: 4.0000 mg | Freq: Four times a day (QID) | INTRAMUSCULAR | Status: DC | PRN

## 2017-01-07 MED ORDER — LACTATED RINGERS IV SOLN
INTRAVENOUS | Status: DC
  Administered 2017-01-07 – 2017-01-08 (×2): via INTRAVENOUS

## 2017-01-07 MED ORDER — HYDROMORPHONE HCL 1 MG/ML IJ SOLN
INTRAMUSCULAR | Status: AC
  Filled 2017-01-07: qty 1

## 2017-01-07 MED ORDER — LIDOCAINE HCL (CARDIAC) 20 MG/ML IV SOLN
INTRAVENOUS | Status: DC | PRN
  Administered 2017-01-07: 80 mg via INTRAVENOUS

## 2017-01-07 MED ORDER — ROCURONIUM BROMIDE 50 MG/5ML IV SOLN
INTRAVENOUS | Status: AC
  Filled 2017-01-07: qty 1

## 2017-01-07 MED ORDER — CEFOXITIN SODIUM-DEXTROSE 2-2.2 GM-% IV SOLR (PREMIX)
INTRAVENOUS | Status: AC
  Filled 2017-01-07: qty 50

## 2017-01-07 MED ORDER — GLIMEPIRIDE 2 MG PO TABS
2.0000 mg | ORAL_TABLET | Freq: Two times a day (BID) | ORAL | Status: DC
  Administered 2017-01-07 – 2017-01-08 (×2): 2 mg via ORAL
  Filled 2017-01-07 (×4): qty 1

## 2017-01-07 MED ORDER — ONDANSETRON HCL 4 MG PO TABS: 4.0000 mg | ORAL_TABLET | Freq: Four times a day (QID) | ORAL | Status: DC | PRN

## 2017-01-07 MED ORDER — MIDAZOLAM HCL 2 MG/2ML IJ SOLN
INTRAMUSCULAR | Status: DC | PRN
  Administered 2017-01-07: 1 mg via INTRAVENOUS

## 2017-01-07 MED ORDER — SEVOFLURANE IN SOLN
RESPIRATORY_TRACT | Status: AC
  Filled 2017-01-07: qty 250

## 2017-01-07 MED ORDER — FENTANYL CITRATE (PF) 100 MCG/2ML IJ SOLN
INTRAMUSCULAR | Status: AC
  Filled 2017-01-07: qty 2

## 2017-01-07 MED ORDER — ATORVASTATIN CALCIUM 20 MG PO TABS
40.0000 mg | ORAL_TABLET | Freq: Every day | ORAL | Status: DC
  Filled 2017-01-07: qty 1

## 2017-01-07 MED ORDER — HYDROCODONE-ACETAMINOPHEN 5-325 MG PO TABS
1.0000 | ORAL_TABLET | ORAL | Status: DC | PRN
  Administered 2017-01-07 (×2): 1 via ORAL
  Filled 2017-01-07 (×2): qty 1

## 2017-01-07 MED ORDER — PHENYLEPHRINE HCL 10 MG/ML IJ SOLN
INTRAMUSCULAR | Status: DC | PRN
  Administered 2017-01-07: 100 ug via INTRAVENOUS

## 2017-01-07 SURGICAL SUPPLY — 39 items
BAG URO DRAIN 2000ML W/SPOUT (MISCELLANEOUS) ×3 IMPLANT
CANISTER SUCT 1200ML W/VALVE (MISCELLANEOUS) ×3 IMPLANT
CATH FOLEY 2WAY  5CC 16FR (CATHETERS) ×2
CATH ROBINSON RED A/P 16FR (CATHETERS) ×3 IMPLANT
CATH URTH 16FR FL 2W BLN LF (CATHETERS) ×1 IMPLANT
COUNTER NEEDLE 20/40 LG (NEEDLE) ×3 IMPLANT
DRAPE PERI LITHO V/GYN (MISCELLANEOUS) ×3 IMPLANT
DRAPE SURG 17X11 SM STRL (DRAPES) ×3 IMPLANT
DRAPE UNDER BUTTOCK W/FLU (DRAPES) ×3 IMPLANT
ELECT REM PT RETURN 9FT ADLT (ELECTROSURGICAL) ×3
ELECTRODE REM PT RTRN 9FT ADLT (ELECTROSURGICAL) ×1 IMPLANT
ETHIBOND 2 0 GREEN CT 2 30IN (SUTURE) IMPLANT
GAUZE PACK 2X3YD (MISCELLANEOUS) ×3 IMPLANT
GLOVE BIO SURGEON STRL SZ8 (GLOVE) ×30 IMPLANT
GOWN STRL REUS W/ TWL LRG LVL3 (GOWN DISPOSABLE) ×3 IMPLANT
GOWN STRL REUS W/ TWL XL LVL3 (GOWN DISPOSABLE) ×1 IMPLANT
GOWN STRL REUS W/TWL LRG LVL3 (GOWN DISPOSABLE) ×6
GOWN STRL REUS W/TWL XL LVL3 (GOWN DISPOSABLE) ×2
KIT RM TURNOVER CYSTO AR (KITS) ×3 IMPLANT
KIT RM TURNOVER STRD PROC AR (KITS) ×3 IMPLANT
LABEL OR SOLS (LABEL) ×3 IMPLANT
NDL SAFETY 22GX1.5 (NEEDLE) ×3 IMPLANT
NS IRRIG 500ML POUR BTL (IV SOLUTION) ×3 IMPLANT
PACK BASIN MINOR ARMC (MISCELLANEOUS) ×3 IMPLANT
PAD OB MATERNITY 4.3X12.25 (PERSONAL CARE ITEMS) ×3 IMPLANT
PAD PREP 24X41 OB/GYN DISP (PERSONAL CARE ITEMS) ×3 IMPLANT
SUT PDS 2-0 27IN (SUTURE) ×3 IMPLANT
SUT PDS AB 2-0 CT1 27 (SUTURE) ×9 IMPLANT
SUT VIC AB 0 CT1 27 (SUTURE) ×6
SUT VIC AB 0 CT1 27XCR 8 STRN (SUTURE) ×3 IMPLANT
SUT VIC AB 0 CT1 36 (SUTURE) ×6 IMPLANT
SUT VIC AB 2-0 CT1 36 (SUTURE) IMPLANT
SUT VIC AB 2-0 SH 27 (SUTURE) ×6
SUT VIC AB 2-0 SH 27XBRD (SUTURE) ×3 IMPLANT
SUT VIC AB 3-0 SH 27 (SUTURE)
SUT VIC AB 3-0 SH 27X BRD (SUTURE) IMPLANT
SYR CONTROL 10ML (SYRINGE) ×3 IMPLANT
SYRINGE 10CC LL (SYRINGE) ×3 IMPLANT
WATER STERILE IRR 1000ML POUR (IV SOLUTION) IMPLANT

## 2017-01-07 NOTE — Transfer of Care (Signed)
Immediate Anesthesia Transfer of Care Note  Patient: [name]  Procedure(s) Performed: Procedure(s): HYSTERECTOMY VAGINAL (N/A) ANTERIOR REPAIR (CYSTOCELE) (N/A) POSTERIOR REPAIR (RECTOCELE) (N/A)  Patient Location: PACU  Anesthesia Type:General  Level of Consciousness: sedated  Airway & Oxygen Therapy: Patient Spontanous Breathing and Patient connected to face mask oxygen  Post-op Assessment: Report given to RN and Post -op Vital signs reviewed and stable  Post vital signs: Reviewed and stable  Last Vitals:  Vitals:   01/07/17 0914  BP: (!) 149/70  Pulse: 70  Resp: 12  Temp: (!) 36.1 C  SpO2: 97%    Last Pain:  Vitals:   01/07/17 0914  TempSrc: Tympanic         Complications: No apparent anesthesia complications

## 2017-01-07 NOTE — Progress Notes (Signed)
Ready for surgery . NPO . All questions answered  

## 2017-01-07 NOTE — Anesthesia Procedure Notes (Signed)
Procedure Name: Intubation Date/Time: 01/07/2017 10:16 AM Performed by: Ginger CarneMICHELET, Mindee Robledo Pre-anesthesia Checklist: Patient identified, Emergency Drugs available, Suction available, Patient being monitored and Timeout performed Patient Re-evaluated:Patient Re-evaluated prior to induction Oxygen Delivery Method: Circle system utilized Preoxygenation: Pre-oxygenation with 100% oxygen Induction Type: IV induction Ventilation: Mask ventilation without difficulty Laryngoscope Size: Miller and 2 Grade View: Grade I Tube type: Oral Tube size: 7.0 mm Number of attempts: 1 Airway Equipment and Method: Stylet Placement Confirmation: ETT inserted through vocal cords under direct vision,  positive ETCO2 and breath sounds checked- equal and bilateral Secured at: 20 cm Tube secured with: Tape Dental Injury: Teeth and Oropharynx as per pre-operative assessment

## 2017-01-07 NOTE — Progress Notes (Signed)
Patient ID: [name], female   DOB: 06/26/1940, 76 y.o.   MRN: [id number] DOS   No c/o Pain ok  VSS  urineoutput good  Add metformin to po meds  Voiding trial in am

## 2017-01-07 NOTE — Anesthesia Preprocedure Evaluation (Signed)
Anesthesia Evaluation  Patient identified by MRN, date of birth, ID band Patient awake    Reviewed: Allergy & Precautions, NPO status , Patient's Chart, lab work & pertinent test results  History of Anesthesia Complications (+) PONV  Airway Mallampati: II       Dental  (+) Upper Dentures, Partial Lower   Pulmonary asthma , neg sleep apnea, neg COPD,           Cardiovascular hypertension, Pt. on medications and Pt. on home beta blockers (-) Past MI and (-) CHF (-) dysrhythmias (-) Valvular Problems/Murmurs     Neuro/Psych neg Seizures    GI/Hepatic Neg liver ROS, hiatal hernia, GERD  Medicated and Controlled,  Endo/Other  diabetes, Type 2, Oral Hypoglycemic Agents  Renal/GU negative Renal ROS     Musculoskeletal   Abdominal   Peds  Hematology  (+) anemia ,   Anesthesia Other Findings   Reproductive/Obstetrics                             Anesthesia Physical Anesthesia Plan  ASA: III  Anesthesia Plan: General   Post-op Pain Management:    Induction: Inhalational  PONV Risk Score and Plan: 3 and Ondansetron, Dexamethasone and Midazolam  Airway Management Planned: Oral ETT  Additional Equipment:   Intra-op Plan:   Post-operative Plan:   Informed Consent: I have reviewed the patients History and Physical, chart, labs and discussed the procedure including the risks, benefits and alternatives for the proposed anesthesia with the patient or authorized representative who has indicated his/her understanding and acceptance.     Plan Discussed with:   Anesthesia Plan Comments:         Anesthesia Quick Evaluation

## 2017-01-07 NOTE — Anesthesia Post-op Follow-up Note (Signed)
Anesthesia QCDR form completed.        

## 2017-01-07 NOTE — Brief Op Note (Signed)
01/07/2017  12:13 PM  PATIENT:  Lynann BolognaPatricia L Ambriz  76 y.o. female  PRE-OPERATIVE DIAGNOSIS:  Pelvic Organ Prolapse Uterine descensus , cystocele POST-OPERATIVE DIAGNOSIS:  Pelvic Organ Prolapse same PROCEDURE:   TVH   anterior colporrhaphy  SURGEON:  Surgeon(s) and Role:    * Schermerhorn, Ihor Austinhomas J, MD - Primary  PHYSICIAN ASSISTANT: ward , chelsea - first assist   ASSISTANTS: [place] PA student   ANESTHESIA:   general  EBL:  Total I/O In: 700 [I.V.:700] Out: 250 [Urine:200; Blood:50]  BLOOD ADMINISTERED:none  DRAINS: Urinary Catheter (Foley)   LOCAL MEDICATIONS USED:  LIDOCAINE   SPECIMEN:  Source of Specimen:  cervix  uterus   DISPOSITION OF SPECIMEN:  PATHOLOGY  COUNTS:  YES  TOURNIQUET:  * No tourniquets in log *  DICTATION: .Other Dictation: Dictation Number verbal  PLAN OF CARE: Admit for overnight observation  PATIENT DISPOSITION:  PACU - hemodynamically stable.   Delay start of Pharmacological VTE agent (>24hrs) due to surgical blood loss or risk of bleeding: not applicable

## 2017-01-08 ENCOUNTER — Encounter: Payer: Self-pay | Admitting: Obstetrics and Gynecology

## 2017-01-08 DIAGNOSIS — N8110 Cystocele, unspecified: Secondary | ICD-10-CM | POA: Diagnosis not present

## 2017-01-08 LAB — CBC
HEMATOCRIT: 34.3 % — AB (ref 35.0–47.0)
HEMOGLOBIN: 11.5 g/dL — AB (ref 12.0–16.0)
MCH: 29 pg (ref 26.0–34.0)
MCHC: 33.7 g/dL (ref 32.0–36.0)
MCV: 86.1 fL (ref 80.0–100.0)
Platelets: 173 10*3/uL (ref 150–440)
RBC: 3.98 MIL/uL (ref 3.80–5.20)
RDW: 15.8 % — ABNORMAL HIGH (ref 11.5–14.5)
WBC: 12.8 10*3/uL — ABNORMAL HIGH (ref 3.6–11.0)

## 2017-01-08 LAB — SURGICAL PATHOLOGY

## 2017-01-08 LAB — BASIC METABOLIC PANEL
ANION GAP: 10 (ref 5–15)
BUN: 21 mg/dL — ABNORMAL HIGH (ref 6–20)
CHLORIDE: 105 mmol/L (ref 101–111)
CO2: 25 mmol/L (ref 22–32)
Calcium: 8.5 mg/dL — ABNORMAL LOW (ref 8.9–10.3)
Creatinine, Ser: 0.75 mg/dL (ref 0.44–1.00)
GFR calc Af Amer: >60 mL/min (ref >60)
GLUCOSE: 94 mg/dL (ref 65–99)
POTASSIUM: 3.9 mmol/L (ref 3.5–5.1)
Sodium: 140 mmol/L (ref 135–145)

## 2017-01-08 LAB — GLUCOSE, CAPILLARY
GLUCOSE-CAPILLARY: 55 mg/dL — AB (ref 65–99)
GLUCOSE-CAPILLARY: 86 mg/dL (ref 65–99)
Glucose-Capillary: 106 mg/dL — ABNORMAL HIGH (ref 65–99)
Glucose-Capillary: 81 mg/dL (ref 65–99)
Glucose-Capillary: 69 mg/dL (ref 65–99)

## 2017-01-08 MED ORDER — HYDROCODONE-ACETAMINOPHEN 5-325 MG PO TABS: 1.0000 | ORAL_TABLET | ORAL | 0 refills | Status: AC | PRN

## 2017-01-08 NOTE — Op Note (Signed)
NAMESUNDUS, PETE NO.:  [id number]  MEDICAL RECORD NO.:  [id number]  LOCATION:                                 FACILITY:  PHYSICIAN:  Jennell Corner, MD     DATE OF BIRTH:  DATE OF PROCEDURE:  01/07/2017 DATE OF DISCHARGE:                              OPERATIVE REPORT   PREOPERATIVE DIAGNOSIS:  Pelvic organ prolapse with third-degree uterine descensus and second-degree cystocele.  POSTOPERATIVE DIAGNOSIS:  Pelvic organ prolapse with third-degree uterine descensus and second-degree cystocele.  PROCEDURE PERFORMED: 1. Total vaginal hysterectomy. 2. Anterior colporrhaphy.  SURGEON:  Jennell Corner, MD  ANESTHESIA:  General endotracheal anesthesia.  FIRST ASSISTANT:  Ward, Chelsea.  SECOND ASSISTANT:  Madelaine Etienne, PA student.  INDICATION:  This is a 76 year old female patient with pelvic organ prolapse with symptomatic bulging from the vagina.  The patient is noted to have cystocele and uterine descensus.  DESCRIPTION OF PROCEDURE:  After adequate general endotracheal anesthesia, the patient was placed in dorsal supine position.  Legs were placed in the candy-cane stirrups.  Lower abdomen, vagina, and perineum were prepped and draped in normal sterile fashion.  A time-out was performed.  The patient did receive 2 g IV cefoxitin prior to commencement of the case.  Straight catheterization of the bladder yielded 100 mL clear urine.  Posterior weighted speculum was placed and the cervix was then grasped with 2 thyroid tenacula and the cervix was then circumferentially injected with 1% lidocaine with 1:100,000 epinephrine.  A direct posterior colpotomy incision was made upon entry into the posterior cul-de-sac.  Uterosacral ligaments were bilaterally clamped, transected, suture ligated with 0 Vicryl suture.  The anterior cervix was incised with the Bovie and the anterior cul-de-sac was entered sharply.  Cardinal ligaments were then bilaterally  clamped, transected, and suture ligated with 0 Vicryl suture.  The uterine arteries were bilaterally clamped, transected, suture ligated with 0 Vicryl suture.  Cornua  were bilaterally clamped, transected, doubly ligated with 0 Vicryl suture.   Pedicles were closed with 0 Vicryl suture.  Good hemostasis was noted. The peritoneum was then closed in a pursestring fashion with 2-0 PDS suture.  Attention was then directed to the large cystocele that was noted at the central portion of the vaginal defect closed to the opening of the vaginal cuff was grasped with 2 Allis clamps, injected centrally with the lidocaine epinephrine solution and opened centrally from the vaginal cuff to 1 cm inferior to the urethral meatus.  The epithelium was then dissected off the cystocele.  Once completed, several mattress sutures were used to grasp healthier endopelvic fascia and reduce the cystocele. Excess vaginal tissues were then excised and the anterior vagina was closed with a running 0 Vicryl suture and the cuff and the rest of the repair closed with an 0 Vicryl suture.  Uterosacral ligaments were plicated centrally, and the rest of the vaginal cuff was closed.  Good hemostasis was noted.  Foley catheter was placed into the bladder at the end of the case yielding additional 100 mL of urine.  There were no complications.  The patient tolerated the procedure well.  ESTIMATED BLOOD LOSS:  50 mL.  URINE OUTPUT:  200 mL.  INTRAOPERATIVE FLUIDS:  700 mL.  The patient was taken to recovery room in good condition.    ______________________________ Jennell Cornerhomas Schermerhorn, MD   ______________________________ Jennell Cornerhomas Schermerhorn, MD    TS/MEDQ  D:  01/07/2017  T:  01/07/2017  Job:  [id number]

## 2017-01-08 NOTE — Discharge Summary (Signed)
Physician Discharge Summary  Patient ID: [name] MRN: [id number] DOB/AGE: 1941/04/18 76 y.o.  Admit date: 01/07/2017 Discharge date: 01/08/2017  Admission Diagnoses:POP  Discharge Diagnoses:  Active Problems:   Post-operative state   Discharged Condition: good  Hospital Course: underwent an uncomplicted TVH and anterior repair . Passed voiding trial . Labs nl   Consults: None  Significant Diagnostic Studies: labs:  Results for orders placed or performed during the hospital encounter of 01/07/17 (from the past 24 hour(s))  Glucose, capillary     Status: Abnormal   Collection Time: 01/07/17 12:31 PM  Result Value Ref Range   Glucose-Capillary 137 (H) 65 - 99 mg/dL  Glucose, capillary     Status: Abnormal   Collection Time: 01/07/17  9:09 PM  Result Value Ref Range   Glucose-Capillary 307 (H) 65 - 99 mg/dL  Glucose, capillary     Status: Abnormal   Collection Time: 01/07/17 11:45 PM  Result Value Ref Range   Glucose-Capillary 224 (H) 65 - 99 mg/dL  Glucose, capillary     Status: Abnormal   Collection Time: 01/08/17  4:40 AM  Result Value Ref Range   Glucose-Capillary 106 (H) 65 - 99 mg/dL  Basic metabolic panel     Status: Abnormal   Collection Time: 01/08/17  5:45 AM  Result Value Ref Range   Sodium 140 135 - 145 mmol/L   Potassium 3.9 3.5 - 5.1 mmol/L   Chloride 105 101 - 111 mmol/L   CO2 25 22 - 32 mmol/L   Glucose, Bld 94 65 - 99 mg/dL   BUN 21 (H) 6 - 20 mg/dL   Creatinine, Ser 2.83 0.44 - 1.00 mg/dL   Calcium 8.5 (L) 8.9 - 10.3 mg/dL   GFR calc non Af Amer >60 >60 mL/min   GFR calc Af Amer >60 >60 mL/min   Anion gap 10 5 - 15  CBC     Status: Abnormal   Collection Time: 01/08/17  5:45 AM  Result Value Ref Range   WBC 12.8 (H) 3.6 - 11.0 K/uL   RBC 3.98 3.80 - 5.20 MIL/uL   Hemoglobin 11.5 (L) 12.0 - 16.0 g/dL   HCT 15.1 (L) 76.1 - 60.7 %   MCV 86.1 80.0 - 100.0 fL   MCH 29.0 26.0 - 34.0 pg   MCHC 33.7 32.0 - 36.0 g/dL   RDW 37.1 (H) 06.2 -  14.5 %   Platelets 173 150 - 440 K/uL  Glucose, capillary     Status: None   Collection Time: 01/08/17  7:30 AM  Result Value Ref Range   Glucose-Capillary 81 65 - 99 mg/dL  Glucose, capillary     Status: Abnormal   Collection Time: 01/08/17 11:29 AM  Result Value Ref Range   Glucose-Capillary 55 (L) 65 - 99 mg/dL  Glucose, capillary     Status: None   Collection Time: 01/08/17 11:47 AM  Result Value Ref Range   Glucose-Capillary 69 65 - 99 mg/dL     Treatments: surgery: as above  Discharge Exam: Blood pressure 120/60, pulse 60, temperature 98.2 F (36.8 C), temperature source Oral, resp. rate 20, height  (1.549 m), weight 54 kg (119 lb), SpO2 100 %. General appearance: alert and cooperative Resp: clear to auscultation bilaterally Cardio: regular rate and rhythm, S1, S2 normal, no murmur, click, rub or gallop GI: soft, non-tender; bowel sounds normal; no masses,  no organomegaly  Disposition: 01-Home or Self Care  Discharge Instructions    Call MD  for:  difficulty breathing, headache or visual disturbances    Complete by:  As directed    Call MD for:  extreme fatigue    Complete by:  As directed    Call MD for:  hives    Complete by:  As directed    Call MD for:  persistant dizziness or light-headedness    Complete by:  As directed    Call MD for:  persistant nausea and vomiting    Complete by:  As directed    Call MD for:  redness, tenderness, or signs of infection (pain, swelling, redness, odor or green/yellow discharge around incision site)    Complete by:  As directed    Call MD for:  temperature >100.4    Complete by:  As directed    Diet - low sodium heart healthy    Complete by:  As directed    Increase activity slowly    Complete by:  As directed      Allergies as of 01/08/2017      Reactions   Augmentin [amoxicillin-pot Clavulanate] Diarrhea, Swelling   Has patient had a PCN reaction causing immediate rash, facial/tongue/throat swelling, SOB or  lightheadedness with hypotension: Yes Has patient had a PCN reaction causing severe rash involving mucus membranes or skin necrosis: No Has patient had a PCN reaction that required hospitalization: No Has patient had a PCN reaction occurring within the last 10 years: Yes If all of the above answers are "NO", then may proceed with Cephalosporin use.   Codeine Swelling   Demerol [meperidine] Swelling   Levaquin [levofloxacin In D5w] Diarrhea, Swelling   Milk-related Compounds Nausea Only, Other (See Comments)   Gi upset (Significant milk intolerance)   Mobic [meloxicam] Hives, Shortness Of Breath   Morphine And Related Swelling   Other Swelling   Raisins    Sulfa Antibiotics Swelling   Wine [alcohol] Swelling      Medication List    TAKE these medications   acetaminophen 325 MG tablet Commonly known as:  TYLENOL Take 2 tablets (650 mg total) by mouth every 4 (four) hours as needed for mild pain (or Temp > 100).   albuterol 108 (90 Base) MCG/ACT inhaler Commonly known as:  PROVENTIL HFA;VENTOLIN HFA Inhale 2 puffs into the lungs every 4 (four) hours as needed (for shortness of breath/wheezing.).   aspirin EC 81 MG tablet Take 81 mg by mouth at bedtime.   atorvastatin 40 MG tablet Commonly known as:  LIPITOR Take 20 mg by mouth at bedtime.   calcium citrate-vitamin D 315-200 MG-UNIT tablet Commonly known as:  CITRACAL+D Take 1 tablet by mouth 2 (two) times a week. Tuesday & Friday   CITRUCEL oral powder Generic drug:  methylcellulose Take 1 packet by mouth daily before lunch.   DIETARY MANAGEMENT PRODUCT PO Take 1 capsule by mouth daily after lunch. IBGARD FOR DIETARY MANAGEMENT OF IBS--AFTER 30 MINUTES OF EATING   glimepiride 4 MG tablet Commonly known as:  AMARYL Take 2 mg by mouth 2 (two) times daily.   HYDROcodone-acetaminophen 5-325 MG tablet Commonly known as:  NORCO/VICODIN Take 1-2 tablets by mouth every 4 (four) hours as needed for moderate pain.    lisinopril 5 MG tablet Commonly known as:  PRINIVIL,ZESTRIL Take 5 mg by mouth at bedtime.   loperamide 2 MG capsule Commonly known as:  IMODIUM Take 2 mg by mouth daily with supper. Hold with constipation (IBS)   metFORMIN 500 MG tablet Commonly known as:  GLUCOPHAGE Take  1,000 mg by mouth 2 (two) times daily.   metoprolol succinate 100 MG 24 hr tablet Commonly known as:  TOPROL-XL Take 100 mg by mouth daily with breakfast.   multivitamin with minerals Tabs tablet Take 1 tablet by mouth daily. Centrum silver   NONFORMULARY OR COMPOUNDED ITEM Place 1 g vaginally every Monday, Wednesday, and Friday. At bedtime.  Estradiol 1g/per applicator prescribed by Dr. Maisie Fus Sayvon Arterberry   omeprazole 20 MG capsule Commonly known as:  PRILOSEC Take 20 mg by mouth daily before breakfast. barrett's esophagus   polyethylene glycol packet Commonly known as:  MIRALAX / GLYCOLAX Take 17 g by mouth daily as needed (for constipation. (IBS)).   potassium chloride SA 20 MEQ tablet Commonly known as:  K-DUR,KLOR-CON Take 20 mEq by mouth daily with breakfast.   triamterene-hydrochlorothiazide 37.5-25 MG tablet Commonly known as:  MAXZIDE-25 Take 1 tablet by mouth daily with breakfast.   VITRON-C 65-125 MG Tabs Generic drug:  Iron-Vitamin C Take 1 tablet by mouth 2 (two) times a week. Tuesday & Friday.            Discharge Care Instructions        Start     Ordered   01/08/17 0000  HYDROcodone-acetaminophen (NORCO/VICODIN) 5-325 MG tablet  Every 4 hours PRN     01/08/17 1159   01/08/17 0000  Increase activity slowly     01/08/17 1159   01/08/17 0000  Diet - low sodium heart healthy     01/08/17 1159   01/08/17 0000  Call MD for:  extreme fatigue     01/08/17 1159   01/08/17 0000  Call MD for:  persistant dizziness or light-headedness     01/08/17 1159   01/08/17 0000  Call MD for:  hives     01/08/17 1159   01/08/17 0000  Call MD for:  difficulty breathing, headache or visual  disturbances     01/08/17 1159   01/08/17 0000  Call MD for:  redness, tenderness, or signs of infection (pain, swelling, redness, odor or green/yellow discharge around incision site)     01/08/17 1159   01/08/17 0000  Call MD for:  persistant nausea and vomiting     01/08/17 1159   01/08/17 0000  Call MD for:  temperature >100.4     01/08/17 1159     Follow-up Information    Tajanae Guilbault, Ihor Austin, MD Follow up in 2 week(s).   Specialty:  Obstetrics and Gynecology Why:  post op  Contact information: [address] [place] [address] [phone]           Signed: Ihor Austin Demetric Dunnaway 01/08/2017, 11:59 AM

## 2017-01-08 NOTE — Progress Notes (Signed)
Patients CBG 55 at 11:29 AM. Patient asymptomatic. 4 oz of orange juice given to patient. Patient CBG 69 at 11:47 AM. Patient still asymptomatic. 4 more oz of orange juice given to patient. Patient CBG 86 at 11:58 AM. Patient has lunch tray at bedside.  

## 2017-01-08 NOTE — Anesthesia Postprocedure Evaluation (Signed)
Anesthesia Post Note  Patient: Lynann Bolognaatricia L Lalani  Procedure(s) Performed: Procedure(s) (LRB): HYSTERECTOMY VAGINAL (N/A) ANTERIOR REPAIR (CYSTOCELE) (N/A)  Patient location during evaluation: PACU Anesthesia Type: General Level of consciousness: awake and alert Pain management: pain level controlled Vital Signs Assessment: post-procedure vital signs reviewed and stable Respiratory status: spontaneous breathing, nonlabored ventilation, respiratory function stable and patient connected to nasal cannula oxygen Cardiovascular status: blood pressure returned to baseline and stable Postop Assessment: no signs of nausea or vomiting Anesthetic complications: no     Last Vitals:  Vitals:   01/08/17 0422 01/08/17 0827  BP: (!) 118/51 (!) 117/54  Pulse: 74 69  Resp: 18 18  Temp: 36.9 C 36.8 C  SpO2: 96%     Last Pain:  Vitals:   01/08/17 0827  TempSrc: Oral  PainSc:                  Cleda MccreedyJoseph K Piscitello

## 2017-01-08 NOTE — Progress Notes (Signed)
Patient discharged home. Discharge instructions, prescriptions and follow up appointment given to and reviewed with patient. Patient verbalized understanding. Escorted out via wheelchair by auxiliary.  

## 2017-02-02 ENCOUNTER — Inpatient Hospital Stay
Admission: EM | Admit: 2017-02-02 | Discharge: 2017-02-04 | DRG: 690 | Disposition: A | Discharge status: Home / Self Care | Payer: Medicare Other | Attending: Internal Medicine | Admitting: Internal Medicine

## 2017-02-02 ENCOUNTER — Emergency Department: Payer: Medicare Other

## 2017-02-02 DIAGNOSIS — N3000 Acute cystitis without hematuria: Secondary | ICD-10-CM | POA: Diagnosis present

## 2017-02-02 DIAGNOSIS — E11649 Type 2 diabetes mellitus with hypoglycemia without coma: Secondary | ICD-10-CM | POA: Diagnosis not present

## 2017-02-02 DIAGNOSIS — K589 Irritable bowel syndrome without diarrhea: Secondary | ICD-10-CM | POA: Diagnosis present

## 2017-02-02 DIAGNOSIS — Z7984 Long term (current) use of oral hypoglycemic drugs: Secondary | ICD-10-CM | POA: Diagnosis not present

## 2017-02-02 DIAGNOSIS — I10 Essential (primary) hypertension: Secondary | ICD-10-CM | POA: Diagnosis present

## 2017-02-02 DIAGNOSIS — Z7982 Long term (current) use of aspirin: Secondary | ICD-10-CM

## 2017-02-02 DIAGNOSIS — Z79899 Other long term (current) drug therapy: Secondary | ICD-10-CM | POA: Diagnosis not present

## 2017-02-02 DIAGNOSIS — Z23 Encounter for immunization: Secondary | ICD-10-CM

## 2017-02-02 DIAGNOSIS — M1991 Primary osteoarthritis, unspecified site: Secondary | ICD-10-CM | POA: Diagnosis present

## 2017-02-02 DIAGNOSIS — R42 Dizziness and giddiness: Secondary | ICD-10-CM

## 2017-02-02 DIAGNOSIS — E785 Hyperlipidemia, unspecified: Secondary | ICD-10-CM | POA: Diagnosis present

## 2017-02-02 DIAGNOSIS — K2270 Barrett's esophagus without dysplasia: Secondary | ICD-10-CM | POA: Diagnosis present

## 2017-02-02 DIAGNOSIS — E119 Type 2 diabetes mellitus without complications: Secondary | ICD-10-CM

## 2017-02-02 DIAGNOSIS — J45909 Unspecified asthma, uncomplicated: Secondary | ICD-10-CM | POA: Diagnosis present

## 2017-02-02 DIAGNOSIS — K219 Gastro-esophageal reflux disease without esophagitis: Secondary | ICD-10-CM | POA: Diagnosis present

## 2017-02-02 DIAGNOSIS — N390 Urinary tract infection, site not specified: Secondary | ICD-10-CM | POA: Diagnosis present

## 2017-02-02 DIAGNOSIS — E860 Dehydration: Secondary | ICD-10-CM | POA: Diagnosis present

## 2017-02-02 LAB — URINALYSIS, COMPLETE (UACMP) WITH MICROSCOPIC
BACTERIA UA: NONE SEEN
BILIRUBIN URINE: NEGATIVE
Glucose, UA: NEGATIVE mg/dL
HGB URINE DIPSTICK: NEGATIVE
KETONES UR: NEGATIVE mg/dL
NITRITE: NEGATIVE
Protein, ur: NEGATIVE mg/dL
Specific Gravity, Urine: 1.008 (ref 1.005–1.030)
pH: 6 (ref 5.0–8.0)

## 2017-02-02 LAB — HEPATIC FUNCTION PANEL
ALBUMIN: 3.7 g/dL (ref 3.5–5.0)
ALK PHOS: 87 U/L (ref 38–126)
ALT: 18 U/L (ref 14–54)
AST: 30 U/L (ref 15–41)
BILIRUBIN TOTAL: 0.4 mg/dL (ref 0.3–1.2)
Bilirubin, Direct: <0.1 mg/dL — ABNORMAL LOW (ref 0.1–0.5)
TOTAL PROTEIN: 7.4 g/dL (ref 6.5–8.1)

## 2017-02-02 LAB — BASIC METABOLIC PANEL
Anion gap: 12 (ref 5–15)
BUN: 20 mg/dL (ref 6–20)
CALCIUM: 9.6 mg/dL (ref 8.9–10.3)
CO2: 25 mmol/L (ref 22–32)
CREATININE: 0.93 mg/dL (ref 0.44–1.00)
Chloride: 100 mmol/L — ABNORMAL LOW (ref 101–111)
GFR calc non Af Amer: 58 mL/min — ABNORMAL LOW (ref >60)
Glucose, Bld: 263 mg/dL — ABNORMAL HIGH (ref 65–99)
Potassium: 4 mmol/L (ref 3.5–5.1)
SODIUM: 137 mmol/L (ref 135–145)

## 2017-02-02 LAB — CBC WITH DIFFERENTIAL/PLATELET
BASOS ABS: 0 10*3/uL (ref 0–0.1)
Basophils Relative: 0 %
EOS ABS: 0 10*3/uL (ref 0–0.7)
EOS PCT: 0 %
HCT: 37.3 % (ref 35.0–47.0)
HEMOGLOBIN: 12.6 g/dL (ref 12.0–16.0)
LYMPHS ABS: 0.7 10*3/uL — AB (ref 1.0–3.6)
Lymphocytes Relative: 9 %
MCH: 29.7 pg (ref 26.0–34.0)
MCHC: 33.7 g/dL (ref 32.0–36.0)
MCV: 88.2 fL (ref 80.0–100.0)
Monocytes Absolute: 0.4 10*3/uL (ref 0.2–0.9)
Monocytes Relative: 5 %
NEUTROS PCT: 86 %
Neutro Abs: 6.5 10*3/uL (ref 1.4–6.5)
PLATELETS: 203 10*3/uL (ref 150–440)
RBC: 4.23 MIL/uL (ref 3.80–5.20)
RDW: 15.3 % — ABNORMAL HIGH (ref 11.5–14.5)
WBC: 7.5 10*3/uL (ref 3.6–11.0)

## 2017-02-02 LAB — TROPONIN I

## 2017-02-02 MED ORDER — SODIUM CHLORIDE 0.9 % IV BOLUS (SEPSIS)
1000.0000 mL | Freq: Once | INTRAVENOUS | Status: AC
  Administered 2017-02-02: 1000 mL via INTRAVENOUS

## 2017-02-02 MED ORDER — ONDANSETRON HCL 4 MG/2ML IJ SOLN
4.0000 mg | Freq: Once | INTRAMUSCULAR | Status: AC
  Administered 2017-02-02: 4 mg via INTRAVENOUS
  Filled 2017-02-02: qty 2

## 2017-02-02 MED ORDER — CEFTRIAXONE SODIUM IN DEXTROSE 20 MG/ML IV SOLN
1.0000 g | Freq: Once | INTRAVENOUS | Status: AC
  Administered 2017-02-02: 1 g via INTRAVENOUS
  Filled 2017-02-02: qty 50

## 2017-02-02 NOTE — ED Provider Notes (Signed)
[facility] Emergency Department Provider Note  ____________________________________________   First MD Initiated Contact with Patient 02/02/17 1830     (approximate)  I have reviewed the triage vital signs and the nursing notes.   HISTORY  Chief Complaint Nausea    HPI [name] is a 76 y.o. female has brought to the emergency department via EMS after feeling nauseated, lightheaded, and having difficulty ambulating. She denies vertigo. She denies tinnitus. She denies double vision or blurred vision. She says she has some urinary frequency. No back pain. No fevers or chills. She checked her blood sugar at home and it was elevated which concerned her. She attempted to ambulate at home today and nearly fell which concerned her so she called 911. Her symptoms have been insidious in onset and slowly progressive.   Past Medical History:  Diagnosis Date  . Arthritis    osteo  . Asthma   . Barrett's esophagus   . Colon polyp   . Diabetes mellitus without complication (HCC)   . GERD (gastroesophageal reflux disease)   . Hyperlipidemia   . Hypertension   . IBS (irritable bowel syndrome)   . Iron deficiency anemia   . Pancreatitis   . PONV (postoperative nausea and vomiting)   . PPD positive     Patient Active Problem List   Diagnosis Date Noted  . UTI (urinary tract infection) 02/02/2017  . Post-operative state 01/07/2017  . HH (hiatus hernia) 11/04/2014    Past Surgical History:  Procedure Laterality Date  . APPENDECTOMY    . BLADDER SUSPENSION    . BREAST BIOPSY Left    twice negative  . CHOLECYSTECTOMY    . COLONOSCOPY  08/09/2008  . COLONOSCOPY  06/07/2014   sessile serrated adenoma and tubular adenoma/Repeat 67yrs/MUS  . COLONOSCOPY WITH PROPOFOL N/A 10/11/2016   Procedure: COLONOSCOPY WITH PROPOFOL;  Surgeon: Christena Deem, MD;  Location[facility] ENDOSCOPY;  Service: Endoscopy;  Laterality: N/A;  . CYSTOCELE REPAIR N/A  01/07/2017   Procedure: ANTERIOR REPAIR (CYSTOCELE);  Surgeon: Schermerhorn, Ihor Austin, MD;  Location: [facility] ORS;  Service: Gynecology;  Laterality: N/A;  . ESOPHAGEAL MANOMETRY N/A 09/22/2014   Procedure: ESOPHAGEAL MANOMETRY (EM);  Surgeon: Elnita Maxwell, MD;  Location: [facility] ENDOSCOPY;  Service: Endoscopy;  Laterality: N/A;  . ESOPHAGOGASTRODUODENOSCOPY  06/07/2014   Barrett's 3 attemps made/Repeat 2 yrs/MUS  . ESOPHAGOGASTRODUODENOSCOPY (EGD) WITH PROPOFOL N/A 10/11/2016   Procedure: ESOPHAGOGASTRODUODENOSCOPY (EGD) WITH PROPOFOL;  Surgeon: Christena Deem, MD;  Location: [facility] ENDOSCOPY;  Service: Endoscopy;  Laterality: N/A;  . HIATAL HERNIA REPAIR N/A 11/04/2014   Procedure: LAPAROSCOPIC REPAIR OF HIATAL HERNIA;  Surgeon: Nadeen Landau, MD;  Location: [facility] ORS;  Service: General;  Laterality: N/A;  . LAPAROSCOPIC BILATERAL SALPINGO OOPHERECTOMY  2015  . LAPAROSCOPIC NISSEN FUNDOPLICATION N/A 11/04/2014   Procedure: LAPAROSCOPIC NISSEN FUNDOPLICATION;  Surgeon: Nadeen Landau, MD;  Location: [facility] ORS;  Service: General;  Laterality: N/A;  . SALPINGECTOMY Bilateral   . VAGINAL HYSTERECTOMY N/A 01/07/2017   Procedure: HYSTERECTOMY VAGINAL;  Surgeon: Suzy Bouchard, MD;  Location: [facility] ORS;  Service: Gynecology;  Laterality: N/A;    Prior to Admission medications   Medication Sig Start Date End Date Taking? Authorizing Provider  acetaminophen (TYLENOL) 325 MG tablet Take 2 tablets (650 mg total) by mouth every 4 (four) hours as needed for mild pain (or Temp > 100). 11/09/14  Yes Nadeen Landau, MD  aspirin EC 81 MG tablet Take 81 mg by  mouth at bedtime.   Yes [provider]  atorvastatin (LIPITOR) 40 MG tablet Take 20 mg by mouth at bedtime.  03/30/14  Yes [provider]  calcium citrate-vitamin D (CITRACAL+D) 315-200 MG-UNIT per tablet Take 1 tablet by mouth 2 (two) times a week. Tuesday & Friday   Yes [provider]  glimepiride (AMARYL)  4 MG tablet Take 2 mg by mouth 2 (two) times daily.  03/30/14  Yes [provider]  Iron-Vitamin C (VITRON-C) 65-125 MG TABS Take 1 tablet by mouth 2 (two) times a week. Tuesday & Friday.   Yes [provider]  lisinopril (PRINIVIL,ZESTRIL) 5 MG tablet Take 5 mg by mouth at bedtime.  03/30/14  Yes [provider]  loperamide (IMODIUM) 2 MG capsule Take 2 mg by mouth daily with supper. Hold with constipation (IBS)   Yes [provider]  metFORMIN (GLUCOPHAGE) 500 MG tablet Take 1,000 mg by mouth 2 (two) times daily.  03/30/14  Yes [provider]  metoprolol succinate (TOPROL-XL) 100 MG 24 hr tablet Take 100 mg by mouth daily with breakfast.  03/30/14  Yes [provider]  Multiple Vitamin (MULTIVITAMIN WITH MINERALS) TABS tablet Take 1 tablet by mouth daily. Centrum silver   Yes [provider]  omeprazole (PRILOSEC) 20 MG capsule Take 20 mg by mouth daily before breakfast. barrett's esophagus 03/30/14  Yes [provider]  potassium chloride SA (K-DUR,KLOR-CON) 20 MEQ tablet Take 20 mEq by mouth daily with breakfast.  03/30/14  Yes [provider]  triamterene-hydrochlorothiazide (MAXZIDE-25) 37.5-25 MG per tablet Take 1 tablet by mouth daily with breakfast.  03/30/14  Yes [provider]  albuterol (PROVENTIL HFA;VENTOLIN HFA) 108 (90 BASE) MCG/ACT inhaler Inhale 2 puffs into the lungs every 4 (four) hours as needed (for shortness of breath/wheezing.).  06/02/14 12/25/17  [provider]  DIETARY MANAGEMENT PRODUCT PO Take 1 capsule by mouth daily after lunch. IBGARD FOR DIETARY MANAGEMENT OF IBS--AFTER 30 MINUTES OF EATING    [provider]  HYDROcodone-acetaminophen (NORCO/VICODIN) 5-325 MG tablet Take 1-2 tablets by mouth every 4 (four) hours as needed for moderate pain. Patient not taking: Reported on 02/02/2017 01/08/17   Schermerhorn, Ihor Austin, MD  methylcellulose (CITRUCEL) oral powder Take 1  packet by mouth daily before lunch.    [provider]  NONFORMULARY OR COMPOUNDED ITEM Place 1 g vaginally every Monday, Wednesday, and Friday. At bedtime.  Estradiol 1g/per applicator prescribed by Dr. Jennell Corner 11/08/16   [provider]  polyethylene glycol (MIRALAX / GLYCOLAX) packet Take 17 g by mouth daily as needed (for constipation. (IBS)).    [provider]    Allergies Augmentin [amoxicillin-pot clavulanate]; Codeine; Demerol [meperidine]; Levaquin [levofloxacin in d5w]; Milk-related compounds; Mobic [meloxicam]; Morphine and related; Other; Sulfa antibiotics; and Wine [alcohol]  Family History  Problem Relation Age of Onset  . Breast cancer Sister 3    Social History Social History  Substance Use Topics  . Smoking status: Never Smoker  . Smokeless tobacco: Never Used  . Alcohol use No    Review of Systems Constitutional: No fever/chills Eyes: No visual changes. ENT: No sore throat. Cardiovascular: Denies chest pain. Respiratory: Denies shortness of breath. Gastrointestinal: No abdominal pain.  positive for nausea, no vomiting.  No diarrhea.  No constipation. Genitourinary: positive for urinary frequency Musculoskeletal: Negative for back pain. Skin: Negative for rash. Neurological: Negative for headaches, focal weakness or numbness.   ____________________________________________   PHYSICAL EXAM:  VITAL SIGNS: ED  Triage Vitals  Enc Vitals Group     BP      Pulse      Resp      Temp      Temp src      SpO2      Weight      Height      Head Circumference      Peak Flow      Pain Score      Pain Loc      Pain Edu?      Excl. in GC?     Constitutional: alert and oriented 4 appears somewhat uncomfortable nontoxic no diaphoresis speaks in full clear sentences Eyes: PERRL EOMI. no nystagmus Head: Atraumatic. Nose: No congestion/rhinnorhea. Mouth/Throat: No trismus Neck: No stridor.   Cardiovascular: Normal  rate, regular rhythm. Grossly normal heart sounds.  Good peripheral circulation. Respiratory: Normal respiratory effort.  No retractions. Lungs CTAB and moving good air Gastrointestinal: soft nondistended nontender no rebound or guarding no peritonitis Musculoskeletal: No lower extremity edema   Neurologic:  Normal speech and language. No gross focal neurologic deficits are appreciated. normal finger-nose-finger no dysdiadochokinesis Skin:  Skin is warm, dry and intact. No rash noted. Psychiatric: Mood and affect are normal. Speech and behavior are normal.    ____________________________________________   DIFFERENTIAL includes but not limited to  dehydration, urinary tract infection, pneumonia, diabetic ketoacidosis, peripheral vertigo, central vertigo ____________________________________________   LABS (all labs ordered are listed, but only abnormal results are displayed)  Labs Reviewed  URINALYSIS, COMPLETE (UACMP) WITH MICROSCOPIC - Abnormal; Notable for the following:       Result Value   Color, Urine YELLOW (*)    APPearance CLEAR (*)    Leukocytes, UA SMALL (*)    Squamous Epithelial / LPF 0-5 (*)    All other components within normal limits  BASIC METABOLIC PANEL - Abnormal; Notable for the following:    Chloride 100 (*)    Glucose, Bld 263 (*)    GFR calc non Af Amer 58 (*)    All other components within normal limits  HEPATIC FUNCTION PANEL - Abnormal; Notable for the following:    Bilirubin, Direct <0.1 (*)    All other components within normal limits  CBC WITH DIFFERENTIAL/PLATELET - Abnormal; Notable for the following:    RDW 15.3 (*)    Lymphs Abs 0.7 (*)    All other components within normal limits  URINE CULTURE  TROPONIN I    blood work reviewed and interpreted by me shows possible urinary tract infection __________________________________________  EKG  ED ECG REPORT I, Merrily Brittle, the attending physician, personally viewed and interpreted this  ECG.  Date: 02/02/2017 EKG Time:  Rate: 95 Rhythm: normal sinus rhythm QRS Axis: normal Intervals: normal ST/T Wave abnormalities: normal Narrative Interpretation: no evidence of acute ischemia  ____________________________________________  RADIOLOGY  chest x-ray reviewed by me as no acute disease ____________________________________________   PROCEDURES  Procedure(s) performed: no  Procedures  Critical Care performed: no  Observation: no ____________________________________________   INITIAL IMPRESSION / ASSESSMENT AND PLAN / ED COURSE  Pertinent labs & imaging results that were available during my care of the patient were reviewed by me and considered in my medical decision making (see chart for details).  The patient arrives somewhat hypertensive and uncomfortable appearing. She reports lightheadedness and not vertigo. She has no central signs of vertigo. Labs, hydration, and urinalysis are pending.     ----------------------------------------- 9:57 PM on 02/02/2017 -----------------------------------------  The  patient's urinalysis has white cellsalthough no bacteria or nitrites. She is symptomatic which is concerning for acute urinary tract infection. I attempted to ambulate the patient and she remains lightheaded and nearly fell. At this point I believe she is deconditioned and dehydrated necessitating IV antibiotics as well as IV fluids and inpatient admission.  I discussed the case with the hospitalist Dr. Anne Hahn who will kindly admit the patient to his service. ____________________________________________   FINAL CLINICAL IMPRESSION(S) / ED DIAGNOSES  Final diagnoses:  Acute cystitis without hematuria  Lightheaded  Dehydration      NEW MEDICATIONS STARTED DURING THIS VISIT:  New Prescriptions   No medications on file     Note:  This document was prepared using Dragon voice recognition software and may include unintentional dictation  errors.     Merrily Brittle, MD 02/02/17 2209

## 2017-02-02 NOTE — H&P (Signed)
[facility] - [place] at [facility]   PATIENT NAME: [name]    MR#:  [id number]  DATE OF BIRTH:  Sep 11, 1940  DATE OF ADMISSION:  02/02/2017  PRIMARY CARE PHYSICIAN: Danella Penton, MD   REQUESTING/REFERRING PHYSICIAN: Lamont Snowball, MD  CHIEF COMPLAINT:   Chief Complaint  Patient presents with  . Nausea    HISTORY OF PRESENT ILLNESS:  [name]  is a 76 y.o. female who presents with Nausea and lightheadedness/dizziness. Patient states these symptoms started over the last 24 hours. Here in the ED workup was largely within normal limits, except for UA mildly suggestive of UTI. Patient was unsteady on her feet when she tried to walk, and so treatment of UTI was started and hospitalists were called for admission  PAST MEDICAL HISTORY:   Past Medical History:  Diagnosis Date  . Arthritis    osteo  . Asthma   . Barrett's esophagus   . Colon polyp   . Diabetes mellitus without complication (HCC)   . GERD (gastroesophageal reflux disease)   . Hyperlipidemia   . Hypertension   . IBS (irritable bowel syndrome)   . Iron deficiency anemia   . Pancreatitis   . PONV (postoperative nausea and vomiting)   . PPD positive     PAST SURGICAL HISTORY:   Past Surgical History:  Procedure Laterality Date  . APPENDECTOMY    . BLADDER SUSPENSION    . BREAST BIOPSY Left    twice negative  . CHOLECYSTECTOMY    . COLONOSCOPY  08/09/2008  . COLONOSCOPY  06/07/2014   sessile serrated adenoma and tubular adenoma/Repeat 12yrs/MUS  . COLONOSCOPY WITH PROPOFOL N/A 10/11/2016   Procedure: COLONOSCOPY WITH PROPOFOL;  Surgeon: Christena Deem, MD;  Location: [facility] ENDOSCOPY;  Service: Endoscopy;  Laterality: N/A;  . CYSTOCELE REPAIR N/A 01/07/2017   Procedure: ANTERIOR REPAIR (CYSTOCELE);  Surgeon: Schermerhorn, Ihor Austin, MD;  Location: [facility] ORS;  Service: Gynecology;  Laterality: N/A;  . ESOPHAGEAL MANOMETRY N/A 09/22/2014   Procedure: ESOPHAGEAL MANOMETRY  (EM);  Surgeon: Elnita Maxwell, MD;  Location: [facility] ENDOSCOPY;  Service: Endoscopy;  Laterality: N/A;  . ESOPHAGOGASTRODUODENOSCOPY  06/07/2014   Barrett's 3 attemps made/Repeat 2 yrs/MUS  . ESOPHAGOGASTRODUODENOSCOPY (EGD) WITH PROPOFOL N/A 10/11/2016   Procedure: ESOPHAGOGASTRODUODENOSCOPY (EGD) WITH PROPOFOL;  Surgeon: Christena Deem, MD;  Location: [facility] ENDOSCOPY;  Service: Endoscopy;  Laterality: N/A;  . HIATAL HERNIA REPAIR N/A 11/04/2014   Procedure: LAPAROSCOPIC REPAIR OF HIATAL HERNIA;  Surgeon: Nadeen Landau, MD;  Location: [facility] ORS;  Service: General;  Laterality: N/A;  . LAPAROSCOPIC BILATERAL SALPINGO OOPHERECTOMY  2015  . LAPAROSCOPIC NISSEN FUNDOPLICATION N/A 11/04/2014   Procedure: LAPAROSCOPIC NISSEN FUNDOPLICATION;  Surgeon: Nadeen Landau, MD;  Location: [facility] ORS;  Service: General;  Laterality: N/A;  . SALPINGECTOMY Bilateral   . VAGINAL HYSTERECTOMY N/A 01/07/2017   Procedure: HYSTERECTOMY VAGINAL;  Surgeon: Suzy Bouchard, MD;  Location: [facility] ORS;  Service: Gynecology;  Laterality: N/A;    SOCIAL HISTORY:   Social History  Substance Use Topics  . Smoking status: Never Smoker  . Smokeless tobacco: Never Used  . Alcohol use No    FAMILY HISTORY:   Family History  Problem Relation Age of Onset  . Breast cancer Sister 64    DRUG ALLERGIES:   Allergies  Allergen Reactions  . Augmentin [Amoxicillin-Pot Clavulanate] Diarrhea and Swelling    Has patient had a PCN reaction causing immediate rash, facial/tongue/throat swelling, SOB or lightheadedness with hypotension: Yes  Has patient had a PCN reaction causing severe rash involving mucus membranes or skin necrosis: No Has patient had a PCN reaction that required hospitalization: No Has patient had a PCN reaction occurring within the last 10 years: Yes If all of the above answers are "NO", then may proceed with Cephalosporin use.   . Codeine Swelling  . Demerol [Meperidine] Swelling  .  Levaquin [Levofloxacin In D5w] Diarrhea and Swelling  . Milk-Related Compounds Nausea Only and Other (See Comments)    Gi upset (Significant milk intolerance)  . Mobic [Meloxicam] Hives and Shortness Of Breath  . Morphine And Related Swelling  . Other Swelling    Raisins   . Sulfa Antibiotics Swelling  . Wine [Alcohol] Swelling    MEDICATIONS AT HOME:   Prior to Admission medications   Medication Sig Start Date End Date Taking? Authorizing Provider  acetaminophen (TYLENOL) 325 MG tablet Take 2 tablets (650 mg total) by mouth every 4 (four) hours as needed for mild pain (or Temp > 100). 11/09/14  Yes Nadeen Landau, MD  aspirin EC 81 MG tablet Take 81 mg by mouth at bedtime.   Yes [provider]  atorvastatin (LIPITOR) 40 MG tablet Take 20 mg by mouth at bedtime.  03/30/14  Yes [provider]  calcium citrate-vitamin D (CITRACAL+D) 315-200 MG-UNIT per tablet Take 1 tablet by mouth 2 (two) times a week. Tuesday & Friday   Yes [provider]  glimepiride (AMARYL) 4 MG tablet Take 2 mg by mouth 2 (two) times daily.  03/30/14  Yes [provider]  Iron-Vitamin C (VITRON-C) 65-125 MG TABS Take 1 tablet by mouth 2 (two) times a week. Tuesday & Friday.   Yes [provider]  lisinopril (PRINIVIL,ZESTRIL) 5 MG tablet Take 5 mg by mouth at bedtime.  03/30/14  Yes [provider]  loperamide (IMODIUM) 2 MG capsule Take 2 mg by mouth daily with supper. Hold with constipation (IBS)   Yes [provider]  metFORMIN (GLUCOPHAGE) 500 MG tablet Take 1,000 mg by mouth 2 (two) times daily.  03/30/14  Yes [provider]  metoprolol succinate (TOPROL-XL) 100 MG 24 hr tablet Take 100 mg by mouth daily with breakfast.  03/30/14  Yes [provider]  Multiple Vitamin (MULTIVITAMIN WITH MINERALS) TABS tablet Take 1 tablet by mouth daily. Centrum silver   Yes [provider]  omeprazole (PRILOSEC) 20 MG capsule Take 20 mg  by mouth daily before breakfast. barrett's esophagus 03/30/14  Yes [provider]  potassium chloride SA (K-DUR,KLOR-CON) 20 MEQ tablet Take 20 mEq by mouth daily with breakfast.  03/30/14  Yes [provider]  triamterene-hydrochlorothiazide (MAXZIDE-25) 37.5-25 MG per tablet Take 1 tablet by mouth daily with breakfast.  03/30/14  Yes [provider]  albuterol (PROVENTIL HFA;VENTOLIN HFA) 108 (90 BASE) MCG/ACT inhaler Inhale 2 puffs into the lungs every 4 (four) hours as needed (for shortness of breath/wheezing.).  06/02/14 12/25/17  [provider]  DIETARY MANAGEMENT PRODUCT PO Take 1 capsule by mouth daily after lunch. IBGARD FOR DIETARY MANAGEMENT OF IBS--AFTER 30 MINUTES OF EATING    [provider]  HYDROcodone-acetaminophen (NORCO/VICODIN) 5-325 MG tablet Take 1-2 tablets by mouth every 4 (four) hours as needed for moderate pain. Patient not taking: Reported on 02/02/2017 01/08/17   Schermerhorn, Ihor Austin, MD  methylcellulose (CITRUCEL) oral powder Take 1 packet by mouth daily before lunch.    [provider]  NONFORMULARY OR COMPOUNDED ITEM Place 1 g vaginally  every Monday, Wednesday, and Friday. At bedtime.  Estradiol 1g/per applicator prescribed by Dr. Jennell Corner 11/08/16   [provider]  polyethylene glycol (MIRALAX / GLYCOLAX) packet Take 17 g by mouth daily as needed (for constipation. (IBS)).    [provider]    REVIEW OF SYSTEMS:  Review of Systems  Constitutional: Positive for malaise/fatigue. Negative for chills, fever and weight loss.  HENT: Negative for ear pain, hearing loss and tinnitus.   Eyes: Negative for blurred vision, double vision, pain and redness.  Respiratory: Negative for cough, hemoptysis and shortness of breath.   Cardiovascular: Negative for chest pain, palpitations, orthopnea and leg swelling.  Gastrointestinal: Negative for abdominal pain, constipation, diarrhea, nausea and vomiting.   Genitourinary: Negative for dysuria, frequency and hematuria.  Musculoskeletal: Negative for back pain, joint pain and neck pain.  Skin:       No acne, rash, or lesions  Neurological: Positive for dizziness. Negative for tremors, focal weakness and weakness.  Endo/Heme/Allergies: Negative for polydipsia. Does not bruise/bleed easily.  Psychiatric/Behavioral: Negative for depression. The patient is not nervous/anxious and does not have insomnia.      VITAL SIGNS:   Vitals:   02/02/17 1831 02/02/17 1835 02/02/17 2000 02/02/17 2100  BP: (!) 154/72  123/67 (!) 144/72  Pulse: 94  99 99  Resp: 12  (!) 25 (!) 21  Temp: (!) 97.4 F (36.3 C)     TempSrc: Oral     SpO2: 99%  98% 98%  Weight:  52.2 kg (115 lb)    Height:   (1.549 m)     Wt Readings from Last 3 Encounters:  02/02/17 52.2 kg (115 lb)  01/07/17 54 kg (119 lb)  12/27/16 54 kg (119 lb)    PHYSICAL EXAMINATION:  Physical Exam  Vitals reviewed. Constitutional: She is oriented to person, place, and time. She appears well-developed and well-nourished. No distress.  HENT:  Head: Normocephalic and atraumatic.  Mouth/Throat: Oropharynx is clear and moist.  Eyes: Pupils are equal, round, and reactive to light. Conjunctivae and EOM are normal. No scleral icterus.  Neck: Normal range of motion. Neck supple. No JVD present. No thyromegaly present.  Cardiovascular: Normal rate, regular rhythm and intact distal pulses.  Exam reveals no gallop and no friction rub.   No murmur heard. Respiratory: Effort normal and breath sounds normal. No respiratory distress. She has no wheezes. She has no rales.  GI: Soft. Bowel sounds are normal. She exhibits no distension. There is no tenderness.  Musculoskeletal: Normal range of motion. She exhibits no edema.  No arthritis, no gout  Lymphadenopathy:    She has no cervical adenopathy.  Neurological: She is alert and oriented to person, place, and time. No cranial nerve deficit.  No  dysarthria, no aphasia  Skin: Skin is warm and dry. No rash noted. No erythema.  Psychiatric: She has a normal mood and affect. Her behavior is normal. Judgment and thought content normal.    LABORATORY PANEL:   CBC  Recent Labs Lab 02/02/17 1827  WBC 7.5  HGB 12.6  HCT 37.3  PLT 203   ------------------------------------------------------------------------------------------------------------------  Chemistries   Recent Labs Lab 02/02/17 1827  NA 137  K 4.0  CL 100*  CO2 25  GLUCOSE 263*  BUN 20  CREATININE 0.93  CALCIUM 9.6  AST 30  ALT 18  ALKPHOS 87  BILITOT 0.4   ------------------------------------------------------------------------------------------------------------------  Cardiac Enzymes  Recent Labs Lab 02/02/17 1827  TROPONINI <0.03   ------------------------------------------------------------------------------------------------------------------  RADIOLOGY:  Dg Chest 2 View  Result Date: 02/02/2017 CLINICAL DATA:  Hyperglycemia, nausea and dizziness. Cough and dyspnea. Hysterectomy 1 month ago. EXAM: CHEST  2 VIEW COMPARISON:  Chest CT 07/27/2014 FINDINGS: Normal heart size. Moderate sized hiatal hernia with air-fluid level. Aortic atherosclerosis at the arch without aneurysm. No acute pneumonic consolidation. Minimal atelectasis at the lung bases. No overt pulmonary edema. No pneumothorax. No acute nor aggressive appearing osseous lesions. IMPRESSION: 1. Moderate-sized hiatal hernia with air-fluid level. 2. Aortic atherosclerosis. 3. Bibasilar minimal atelectasis. Electronically Signed   By: Tollie Eth M.D.   On: 02/02/2017 21:45    EKG:   Orders placed or performed during the hospital encounter of 02/02/17  . ED EKG  . ED EKG  . EKG 12-Lead  . EKG 12-Lead    IMPRESSION AND PLAN:  Principal Problem:   UTI (urinary tract infection) - IV antibiotics started, culture sent Active Problems:   HTN (hypertension) - stable, continue home  meds   Diabetes (HCC) - sliding scale insulin with corresponding glucose checks   HLD (hyperlipidemia) - home meds   GERD (gastroesophageal reflux disease) - home dose PPI  All the records are reviewed and case discussed with ED provider. Management plans discussed with the patient and/or family.  DVT PROPHYLAXIS: SubQ lovenox  GI PROPHYLAXIS: PPI  ADMISSION STATUS: Inpatient  CODE STATUS: Full Code Status History    Date Active Date Inactive Code Status Order ID Comments User Context   01/07/2017  1:43 PM 01/08/2017  4:18 PM Full Code [id number]  Schermerhorn, Ihor Austin, MD Inpatient   11/04/2014 10:01 PM 11/09/2014  5:25 PM Full Code [id number]  Nadeen Landau, MD Inpatient      TOTAL TIME TAKING CARE OF THIS PATIENT: 45 minutes.   Kyrra Prada FIELDING 02/02/2017, 10:48 PM  [facility] [place] [facility]  Office  [phone]  CC: Primary care physician; Danella Penton, MD  Note:  This document was prepared using Dragon voice recognition software and may include unintentional dictation errors.

## 2017-02-02 NOTE — ED Triage Notes (Signed)
Pt brought in by [facility] from home.  Pt reports being nauseated, but unable to vomit.  Pt had hysterectomy 1 month ago.  Pt states she has also been dizzy at times.  Pt reports BS of 500's at noon, but EMS reports BS 256.  Pt A&Ox4 and in NAD at this time.

## 2017-02-02 NOTE — ED Notes (Signed)
EDP notified that patient states she feels dizzy when she gets up.  Pt states that she does not feel safe to go home by herself.  EDP acknowledged.

## 2017-02-03 LAB — BASIC METABOLIC PANEL
ANION GAP: 8 (ref 5–15)
BUN: 12 mg/dL (ref 6–20)
CHLORIDE: 106 mmol/L (ref 101–111)
CO2: 27 mmol/L (ref 22–32)
Calcium: 8.8 mg/dL — ABNORMAL LOW (ref 8.9–10.3)
Creatinine, Ser: 0.61 mg/dL (ref 0.44–1.00)
GFR calc non Af Amer: >60 mL/min (ref >60)
Glucose, Bld: 82 mg/dL (ref 65–99)
POTASSIUM: 3.5 mmol/L (ref 3.5–5.1)
Sodium: 141 mmol/L (ref 135–145)

## 2017-02-03 LAB — GLUCOSE, CAPILLARY
GLUCOSE-CAPILLARY: 142 mg/dL — AB (ref 65–99)
GLUCOSE-CAPILLARY: 150 mg/dL — AB (ref 65–99)
GLUCOSE-CAPILLARY: 156 mg/dL — AB (ref 65–99)
GLUCOSE-CAPILLARY: 59 mg/dL — AB (ref 65–99)
Glucose-Capillary: 112 mg/dL — ABNORMAL HIGH (ref 65–99)
Glucose-Capillary: 134 mg/dL — ABNORMAL HIGH (ref 65–99)
Glucose-Capillary: 58 mg/dL — ABNORMAL LOW (ref 65–99)

## 2017-02-03 LAB — CBC
HCT: 33.5 % — ABNORMAL LOW (ref 35.0–47.0)
HEMOGLOBIN: 11.5 g/dL — AB (ref 12.0–16.0)
MCH: 29.6 pg (ref 26.0–34.0)
MCHC: 34.3 g/dL (ref 32.0–36.0)
MCV: 86.3 fL (ref 80.0–100.0)
PLATELETS: 181 10*3/uL (ref 150–440)
RBC: 3.88 MIL/uL (ref 3.80–5.20)
RDW: 15.2 % — ABNORMAL HIGH (ref 11.5–14.5)
WBC: 6.3 10*3/uL (ref 3.6–11.0)

## 2017-02-03 MED ORDER — ONDANSETRON HCL 4 MG/2ML IJ SOLN
4.0000 mg | Freq: Four times a day (QID) | INTRAMUSCULAR | Status: DC | PRN
  Administered 2017-02-03: 4 mg via INTRAVENOUS
  Filled 2017-02-03: qty 2

## 2017-02-03 MED ORDER — METOPROLOL SUCCINATE ER 25 MG PO TB24
12.5000 mg | ORAL_TABLET | Freq: Every day | ORAL | Status: DC
  Administered 2017-02-04: 12.5 mg via ORAL
  Filled 2017-02-03: qty 1

## 2017-02-03 MED ORDER — METFORMIN HCL 500 MG PO TABS
1000.0000 mg | ORAL_TABLET | Freq: Two times a day (BID) | ORAL | Status: DC
  Administered 2017-02-03 – 2017-02-04 (×2): 1000 mg via ORAL
  Filled 2017-02-03 (×3): qty 2

## 2017-02-03 MED ORDER — ACETAMINOPHEN 650 MG RE SUPP: 650.0000 mg | Freq: Four times a day (QID) | RECTAL | Status: DC | PRN

## 2017-02-03 MED ORDER — PANTOPRAZOLE SODIUM 40 MG PO TBEC
40.0000 mg | DELAYED_RELEASE_TABLET | Freq: Every day | ORAL | Status: DC
  Administered 2017-02-03 – 2017-02-04 (×2): 40 mg via ORAL
  Filled 2017-02-03 (×2): qty 1

## 2017-02-03 MED ORDER — ENOXAPARIN SODIUM 40 MG/0.4ML ~~LOC~~ SOLN
40.0000 mg | SUBCUTANEOUS | Status: DC
  Administered 2017-02-03: 40 mg via SUBCUTANEOUS
  Filled 2017-02-03: qty 0.4

## 2017-02-03 MED ORDER — ATORVASTATIN CALCIUM 20 MG PO TABS
20.0000 mg | ORAL_TABLET | Freq: Every day | ORAL | Status: DC
  Administered 2017-02-03 (×2): 20 mg via ORAL
  Filled 2017-02-03 (×2): qty 1

## 2017-02-03 MED ORDER — INSULIN ASPART 100 UNIT/ML ~~LOC~~ SOLN: 0.0000 [IU] | Freq: Every day | SUBCUTANEOUS | Status: DC

## 2017-02-03 MED ORDER — DEXTROSE 5 % IV SOLN
2.0000 g | INTRAVENOUS | Status: DC
  Administered 2017-02-03: 2 g via INTRAVENOUS
  Filled 2017-02-03 (×2): qty 2

## 2017-02-03 MED ORDER — INSULIN ASPART 100 UNIT/ML ~~LOC~~ SOLN
0.0000 [IU] | Freq: Three times a day (TID) | SUBCUTANEOUS | Status: DC
  Administered 2017-02-03: 1 [IU] via SUBCUTANEOUS
  Filled 2017-02-03: qty 1

## 2017-02-03 MED ORDER — METOPROLOL SUCCINATE ER 50 MG PO TB24
100.0000 mg | ORAL_TABLET | Freq: Every day | ORAL | Status: DC
  Filled 2017-02-03: qty 2

## 2017-02-03 MED ORDER — LISINOPRIL 5 MG PO TABS
5.0000 mg | ORAL_TABLET | Freq: Every day | ORAL | Status: DC
  Administered 2017-02-03: 5 mg via ORAL
  Filled 2017-02-03: qty 1

## 2017-02-03 MED ORDER — ONDANSETRON HCL 4 MG PO TABS: 4.0000 mg | ORAL_TABLET | Freq: Four times a day (QID) | ORAL | Status: DC | PRN

## 2017-02-03 MED ORDER — PROMETHAZINE HCL 25 MG/ML IJ SOLN
12.5000 mg | INTRAMUSCULAR | Status: AC
  Administered 2017-02-03: 12.5 mg via INTRAVENOUS
  Filled 2017-02-03: qty 1

## 2017-02-03 MED ORDER — ASPIRIN EC 81 MG PO TBEC
81.0000 mg | DELAYED_RELEASE_TABLET | Freq: Every day | ORAL | Status: DC
  Administered 2017-02-03 (×2): 81 mg via ORAL
  Filled 2017-02-03 (×3): qty 1

## 2017-02-03 MED ORDER — SODIUM CHLORIDE 0.9 % IV SOLN
INTRAVENOUS | Status: DC
  Administered 2017-02-03: 14:00:00 via INTRAVENOUS

## 2017-02-03 MED ORDER — MECLIZINE HCL 25 MG PO TABS
25.0000 mg | ORAL_TABLET | Freq: Three times a day (TID) | ORAL | Status: DC | PRN
  Administered 2017-02-03: 25 mg via ORAL
  Filled 2017-02-03 (×2): qty 1

## 2017-02-03 MED ORDER — PNEUMOCOCCAL VAC POLYVALENT 25 MCG/0.5ML IJ INJ
0.5000 mL | INJECTION | INTRAMUSCULAR | Status: AC
  Administered 2017-02-04: 0.5 mL via INTRAMUSCULAR
  Filled 2017-02-03: qty 0.5

## 2017-02-03 MED ORDER — SODIUM CHLORIDE 0.9 % IV SOLN
INTRAVENOUS | Status: AC
  Administered 2017-02-03: 75 mL/h via INTRAVENOUS

## 2017-02-03 MED ORDER — ACETAMINOPHEN 325 MG PO TABS: 650.0000 mg | ORAL_TABLET | Freq: Four times a day (QID) | ORAL | Status: DC | PRN

## 2017-02-03 MED ORDER — INFLUENZA VAC SPLIT HIGH-DOSE 0.5 ML IM SUSY
0.5000 mL | PREFILLED_SYRINGE | INTRAMUSCULAR | Status: AC
  Administered 2017-02-04: 0.5 mL via INTRAMUSCULAR
  Filled 2017-02-03 (×2): qty 0.5

## 2017-02-03 NOTE — Progress Notes (Signed)
[facility] - [place] at [facility]   PATIENT NAME: [name]    MR#:  [id number]  DATE OF BIRTH:  11-Jun-1940  SUBJECTIVE:  CHIEF COMPLAINT:   Chief Complaint  Patient presents with  . Nausea   -Complains of dizziness/weakness on ambulation -Blood pressure was low this morning. Also complains of nausea  REVIEW OF SYSTEMS:  Review of Systems  Constitutional: Positive for malaise/fatigue. Negative for chills and fever.  HENT: Negative for congestion, ear pain, hearing loss, sinus pain and tinnitus.   Eyes: Negative for blurred vision.  Respiratory: Negative for cough, shortness of breath and wheezing.   Cardiovascular: Negative for chest pain, palpitations and leg swelling.  Gastrointestinal: Positive for nausea. Negative for abdominal pain, constipation, diarrhea and vomiting.  Genitourinary: Negative for dysuria.  Musculoskeletal: Positive for back pain and myalgias.  Neurological: Positive for dizziness and weakness. Negative for seizures and headaches.  Psychiatric/Behavioral: Negative for depression and suicidal ideas.    DRUG ALLERGIES:   Allergies  Allergen Reactions  . Augmentin [Amoxicillin-Pot Clavulanate] Diarrhea and Swelling    Has patient had a PCN reaction causing immediate rash, facial/tongue/throat swelling, SOB or lightheadedness with hypotension: Yes Has patient had a PCN reaction causing severe rash involving mucus membranes or skin necrosis: No Has patient had a PCN reaction that required hospitalization: No Has patient had a PCN reaction occurring within the last 10 years: Yes If all of the above answers are "NO", then may proceed with Cephalosporin use.   . Codeine Swelling  . Demerol [Meperidine] Swelling  . Levaquin [Levofloxacin In D5w] Diarrhea and Swelling  . Milk-Related Compounds Nausea Only and Other (See Comments)    Gi upset (Significant milk intolerance)  . Mobic [Meloxicam] Hives and Shortness Of Breath  .  Morphine And Related Swelling  . Other Swelling    Raisins   . Sulfa Antibiotics Swelling  . Wine [Alcohol] Swelling    VITALS:  Blood pressure (!) 112/48, pulse 87, temperature 98.7 F (37.1 C), temperature source Oral, resp. rate 18, height  (1.549 m), weight 52.8 kg (116 lb 8 oz), SpO2 96 %.  PHYSICAL EXAMINATION:  Physical Exam  GENERAL:  76 y.o.-year-old patient lying in the bed with no acute distress.  EYES: Pupils equal, round, reactive to light and accommodation. No scleral icterus. Extraocular muscles intact.  HEENT: Head atraumatic, normocephalic. Oropharynx and nasopharynx clear.  NECK:  Supple, no jugular venous distention. No thyroid enlargement, no tenderness.  LUNGS: Normal breath sounds bilaterally, no wheezing, rales,rhonchi or crepitation. No use of accessory muscles of respiration.  CARDIOVASCULAR: S1, S2 normal. No  rubs, or gallops. 3/6 systolic murmur is present ABDOMEN: Soft, nontender, nondistended. Bowel sounds present. No organomegaly or mass.  EXTREMITIES: No pedal edema, cyanosis, or clubbing.  NEUROLOGIC: Cranial nerves II through XII are intact. Muscle strength 5/5 in all extremities. Sensation intact. Gait not checked.  PSYCHIATRIC: The patient is alert and oriented x 3.  SKIN: No obvious rash, lesion, or ulcer.    LABORATORY PANEL:   CBC  Recent Labs Lab 02/03/17 0406  WBC 6.3  HGB 11.5*  HCT 33.5*  PLT 181   ------------------------------------------------------------------------------------------------------------------  Chemistries   Recent Labs Lab 02/02/17 1827 02/03/17 0406  NA 137 141  K 4.0 3.5  CL 100* 106  CO2 25 27  GLUCOSE 263* 82  BUN 20 12  CREATININE 0.93 0.61  CALCIUM 9.6 8.8*  AST 30  --   ALT 18  --   ALKPHOS  87  --   BILITOT 0.4  --    ------------------------------------------------------------------------------------------------------------------  Cardiac Enzymes  Recent Labs Lab 02/02/17 1827    TROPONINI <0.03   ------------------------------------------------------------------------------------------------------------------  RADIOLOGY:  Dg Chest 2 View  Result Date: 02/02/2017 CLINICAL DATA:  Hyperglycemia, nausea and dizziness. Cough and dyspnea. Hysterectomy 1 month ago. EXAM: CHEST  2 VIEW COMPARISON:  Chest CT 07/27/2014 FINDINGS: Normal heart size. Moderate sized hiatal hernia with air-fluid level. Aortic atherosclerosis at the arch without aneurysm. No acute pneumonic consolidation. Minimal atelectasis at the lung bases. No overt pulmonary edema. No pneumothorax. No acute nor aggressive appearing osseous lesions. IMPRESSION: 1. Moderate-sized hiatal hernia with air-fluid level. 2. Aortic atherosclerosis. 3. Bibasilar minimal atelectasis. Electronically Signed   By: Tollie Eth M.D.   On: 02/02/2017 21:45    EKG:   Orders placed or performed during the hospital encounter of 02/02/17  . EKG 12-Lead  . EKG 12-Lead    ASSESSMENT AND PLAN:   76 year old female with past medical history significant for hypertension, diabetes, hyperlipidemia and GERD presents to hospital secondary to worsening nausea and dizziness.  #1 acute cystitis-likely causing her weakness and nausea and dizziness. -Blood pressure is low, continue IV fluids -Check orthostatics. -Awaiting urine cultures. Currently on Rocephin  #2 hypertension-since blood pressure is low, decrease the dose of metoprolol and hold lisinopril for now  #3 diabetes mellitus with hypoglycemia today-secondary to infection. Hold  glimepiride for now. -Metformin can be restarted. Also on sliding scale insulin  #4 DVT prophylaxis-Lovenox  PT recommended outpatient services     All the records are reviewed and case discussed with Care Management/Social Workerr. Management plans discussed with the patient, family and they are in agreement.  CODE STATUS: Full code  TOTAL TIME TAKING CARE OF THIS PATIENT: 36 minutes.    POSSIBLE D/C IN 1-2 DAYS, DEPENDING ON CLINICAL CONDITION.   Sana Tessmer M.D on 02/03/2017 at 12:52 PM  Between 7am to 6pm - Pager - [phone]  After 6pm go to [email or web address] - Social research officer, government  [facility] [place] [facility]  Office  [phone]  CC: Primary care physician; Danella Penton, MD

## 2017-02-03 NOTE — Progress Notes (Signed)
Physical Therapy Evaluation Patient Details Name: [name] MRN: [id number] DOB: 1941/03/04 Today's Date: 02/03/2017   History of Present Illness  Patient is a 76 y.o. female admitted on 02 Feb 2017 with increased nausea/dizziness. PMH includes HTN, HLD, GERD, and DMII.  Clinical Impression  Patient is a previously independent female s/p vaginal hysterectomy ~4 weeks ago, admitted for above listed reasons. Patient on evaluation complains of intermittent dizziness when looking to the right, feeling as if objects shift to the right. Denies diplopia, dysphagia, dysarthria, and drop attacks. PT assessed visual tracking, noting no nystagmus but increased dizziness. Head shake did not elicit symptoms. Patient's symptoms do not seem vertiginous in nature, but they may be cervicogenic based on her subjective description. While patient does not have significant cardiac history and reportedly underwent recent work up with Dr. Hyacinth Meeker, she may benefit from imaging to r/o and ensure no carotid/basilar artery involvement prior to commencing OP vestibular evaluation to further differentiate cause of new onset symptoms. It is believed that she will require OOB mobility assistance with RW until she returns to her PLOF.    Follow Up Recommendations Outpatient PT;Supervision for mobility/OOB    Equipment Recommendations  Rolling walker with 5" wheels    Recommendations for Other Services       Precautions / Restrictions Precautions Precautions: Fall Restrictions Weight Bearing Restrictions: No      Mobility  Bed Mobility Overal bed mobility: Independent             General bed mobility comments: Patient performs bed mobility independently.  Transfers Overall transfer level: Independent Equipment used: Rolling walker (2 wheeled)             General transfer comment: Patient moves from sit to stand with increased dizziness. No LOB.  Ambulation/Gait Ambulation/Gait assistance:  Min guard Ambulation Distance (Feet): 15 Feet Assistive device: Rolling walker (2 wheeled)       General Gait Details: Patient ambulates at decreased cadence with RW, complaining of dizziness. No LOB.  Stairs            Wheelchair Mobility    Modified Rankin (Stroke Patients Only)       Balance Overall balance assessment: Needs assistance Sitting-balance support: Bilateral upper extremity supported Sitting balance-Leahy Scale: Good     Standing balance support: Bilateral upper extremity supported Standing balance-Leahy Scale: Fair                               Pertinent Vitals/Pain Pain Assessment: No/denies pain    Home Living Family/patient expects to be discharged to:: Private residence Living Arrangements: Alone Available Help at Discharge: Friend(s);Available PRN/intermittently Type of Home: Apartment Home Access: Elevator     Home Layout: One level Home Equipment: Hand held shower head;Grab bars - toilet;Grab bars - tub/shower;Cane - single point      Prior Function Level of Independence: Independent         Comments: Patient lives in handicapped accessible apt and is previously independent.     Hand Dominance        Extremity/Trunk Assessment   Upper Extremity Assessment Upper Extremity Assessment: Overall WFL for tasks assessed    Lower Extremity Assessment Lower Extremity Assessment: Overall WFL for tasks assessed       Communication   Communication: No difficulties  Cognition Arousal/Alertness: Awake/alert Behavior During Therapy: WFL for tasks assessed/performed Overall Cognitive Status: Within Functional Limits for tasks assessed  General Comments      Exercises     Assessment/Plan    PT Assessment Patient needs continued PT services  PT Problem List Decreased balance;Decreased activity tolerance;Decreased knowledge of use of DME;Decreased mobility        PT Treatment Interventions DME instruction;Gait training;Functional mobility training;Therapeutic activities;Therapeutic exercise;Balance training;Patient/family education    PT Goals (Current goals can be found in the Care Plan section)  Acute Rehab PT Goals Patient Stated Goal: "To not be dizzy" PT Goal Formulation: With patient Time For Goal Achievement: 02/17/17 Potential to Achieve Goals: Good    Frequency Min 2X/week   Barriers to discharge Inaccessible home environment;Decreased caregiver support      Co-evaluation               AM-PAC PT "6 Clicks" Daily Activity  Outcome Measure Difficulty turning over in bed (including adjusting bedclothes, sheets and blankets)?: A Little Difficulty moving from lying on back to sitting on the side of the bed? : A Little Difficulty sitting down on and standing up from a chair with arms (e.g., wheelchair, bedside commode, etc,.)?: A Little Help needed moving to and from a bed to chair (including a wheelchair)?: A Little Help needed walking in hospital room?: A Little Help needed climbing 3-5 steps with a railing? : A Little 6 Click Score: 18    End of Session Equipment Utilized During Treatment: Gait belt Activity Tolerance: Patient tolerated treatment well;Other (comment) (Limited by dizziness) Patient left: in chair;with call bell/phone within reach;with chair alarm set Nurse Communication: Mobility status PT Visit Diagnosis: Unsteadiness on feet (R26.81);Dizziness and giddiness (R42)    Time: 1610-9604 PT Time Calculation (min) (ACUTE ONLY): 23 min   Charges:   PT Evaluation $PT Eval Low Complexity: 1 Low     PT G Codes:   PT G-Codes **NOT FOR INPATIENT CLASS** Functional Assessment Tool Used: AM-PAC 6 Clicks Basic Mobility;Clinical judgement Functional Limitation: Mobility: Walking and moving around Mobility: Walking and Moving Around Current Status (V4098): At least 40 percent but less than 60 percent impaired,  limited or restricted Mobility: Walking and Moving Around Goal Status [phone]): At least 1 percent but less than 20 percent impaired, limited or restricted      Neita Carp, PT, DPT 02/03/2017, 12:29 PM

## 2017-02-03 NOTE — Progress Notes (Signed)
Hypoglycemic Event  CBG: 58  Treatment: 15 GM carbohydrate snack  Symptoms: Shaky and Hungry  Follow-up CBG: Time: 0057   CBG Result: 142  Possible Reasons for Event: Inadequate meal intake  Comments/MD notified: hypoglycemic protocol implemented    Kwynn Schlotter, Darnelle Maffucci Mae C

## 2017-02-03 NOTE — Progress Notes (Signed)
Pharmacy Antibiotic Note  [name] is a 76 y.o. female admitted on 02/02/2017 with UTI.  Pharmacy has been consulted for ceftriaxone dosing.  Plan: Ceftriaxone 2 grams q 24 hours ordered.  Height:  (154.9 cm) Weight: 116 lb 8 oz (52.8 kg) IBW/kg (Calculated) : 47.8  Temp (24hrs), Avg:98.1 F (36.7 C), Min:97.4 F (36.3 C), Max:98.8 F (37.1 C)   Recent Labs Lab 02/02/17 1827  WBC 7.5  CREATININE 0.93    Estimated Creatinine Clearance: 38.8 mL/min (by C-G formula based on SCr of 0.93 mg/dL).    Allergies  Allergen Reactions  . Augmentin [Amoxicillin-Pot Clavulanate] Diarrhea and Swelling    Has patient had a PCN reaction causing immediate rash, facial/tongue/throat swelling, SOB or lightheadedness with hypotension: Yes Has patient had a PCN reaction causing severe rash involving mucus membranes or skin necrosis: No Has patient had a PCN reaction that required hospitalization: No Has patient had a PCN reaction occurring within the last 10 years: Yes If all of the above answers are "NO", then may proceed with Cephalosporin use.   . Codeine Swelling  . Demerol [Meperidine] Swelling  . Levaquin [Levofloxacin In D5w] Diarrhea and Swelling  . Milk-Related Compounds Nausea Only and Other (See Comments)    Gi upset (Significant milk intolerance)  . Mobic [Meloxicam] Hives and Shortness Of Breath  . Morphine And Related Swelling  . Other Swelling    Raisins   . Sulfa Antibiotics Swelling  . Wine [Alcohol] Swelling    Antimicrobials this admission: Ceftriaxone 10/6  >>    >>   Dose adjustments this admission:   Microbiology results: 10/6 UCx: pending       10/6 UA: LE(+) NO2(-)  WBC 6-30 Thank you for allowing pharmacy to be a part of this patient's care.  Keino Placencia S 02/03/2017 12:20 AM

## 2017-02-03 NOTE — Progress Notes (Signed)
New Admission Note:   Arrival Method: per stretcher from ED, pt came from home Mental Orientation: alert and oriented X4 Telemetry: none ordered Assessment: Completed Skin: warm, dry, intact, no preexisting wound/sore noted, prophylactic sacral foam applied IV: G20 on the right AC with transparent dressing, intact, flushed Pain: denies having any pain as of this time Safety Measures: Safety Fall Prevention Plan has been given, discussed and signed Admission: Completed [address] [place] Orientation: Patient has been oriented to the room, unit and staff.  Family:  Orders have been reviewed and implemented. Call light has been placed within reach, yellow socks on, fall risk arm band applied and bed alarm activated. Will continue to monitor patient.   Janice Norrie, RN [facility]

## 2017-02-04 LAB — BASIC METABOLIC PANEL
Anion gap: 11 (ref 5–15)
BUN: 12 mg/dL (ref 6–20)
CALCIUM: 8.3 mg/dL — AB (ref 8.9–10.3)
CHLORIDE: 107 mmol/L (ref 101–111)
CO2: 26 mmol/L (ref 22–32)
CREATININE: 0.7 mg/dL (ref 0.44–1.00)
GFR calc Af Amer: >60 mL/min (ref >60)
GFR calc non Af Amer: >60 mL/min (ref >60)
GLUCOSE: 80 mg/dL (ref 65–99)
Potassium: 3.6 mmol/L (ref 3.5–5.1)
Sodium: 144 mmol/L (ref 135–145)

## 2017-02-04 LAB — URINE CULTURE: Culture: <10000 — AB

## 2017-02-04 LAB — GLUCOSE, CAPILLARY
Glucose-Capillary: 81 mg/dL (ref 65–99)
Glucose-Capillary: 85 mg/dL (ref 65–99)

## 2017-02-04 LAB — MAGNESIUM: Magnesium: 1.1 mg/dL — ABNORMAL LOW (ref 1.7–2.4)

## 2017-02-04 MED ORDER — MAGNESIUM SULFATE 2 GM/50ML IV SOLN: 2.0000 g | Freq: Once | INTRAVENOUS | Status: DC

## 2017-02-04 MED ORDER — CEFUROXIME AXETIL 250 MG PO TABS: 250.0000 mg | ORAL_TABLET | Freq: Two times a day (BID) | ORAL | 0 refills | Status: AC

## 2017-02-04 MED ORDER — MAGNESIUM SULFATE 2 GM/50ML IV SOLN
2.0000 g | Freq: Once | INTRAVENOUS | Status: AC
  Administered 2017-02-04: 2 g via INTRAVENOUS
  Filled 2017-02-04: qty 50

## 2017-02-04 MED ORDER — MECLIZINE HCL 12.5 MG PO TABS: 25.0000 mg | ORAL_TABLET | Freq: Three times a day (TID) | ORAL | 0 refills | Status: AC | PRN

## 2017-02-04 NOTE — Care Management Note (Signed)
Case Management Note  Patient Details  Name: [name] MRN: [id number] Date of Birth: 03-13-1941  Subjective/Objective: Met with patient at bedside. She is agreeable to outpatient PT. States she is no sure how often she will be able to go since she doesn't drive or have reliable transportation but she is willing to try. Referral sent via fax to [facility] outpatient PT department. Patient lives alone and has little to no family. She has friends that assist her. [name] is her PCP. Patient refused a walker.                     Action/Plan: OP PT at [facility]  Expected Discharge Date:  02/04/17               Expected Discharge Plan:  OP Rehab  In-House Referral:     Discharge planning Services  CM Consult  Post Acute Care Choice:    Choice offered to:  Patient  DME Arranged:    DME Agency:     HH Arranged:    [place] Agency:     Status of Service:  Completed, signed off  If discussed at [employer] of Stay Meetings, dates discussed:    Additional Comments:  Jolly Mango, RN 02/04/2017, 3:04 PM

## 2017-02-04 NOTE — Progress Notes (Signed)
Discharge summary reviewed with verbal understanding. Answered all questions. 2 Rxs and belongings packed at discharge.

## 2017-02-07 NOTE — Discharge Summary (Signed)
[facility] [place] at [facility]   PATIENT NAME: [name]    MR#:  [id number]  DATE OF BIRTH:  19-Oct-1940  DATE OF ADMISSION:  02/02/2017 ADMITTING PHYSICIAN: Oralia Manis, MD  DATE OF DISCHARGE: 02/04/2017  4:06 PM  PRIMARY CARE PHYSICIAN: Danella Penton, MD    ADMISSION DIAGNOSIS:  Dehydration [E86.0] Lightheaded [R42] Acute cystitis without hematuria [N30.00]  DISCHARGE DIAGNOSIS:  Principal Problem:   UTI (urinary tract infection) Active Problems:   HTN (hypertension)   HLD (hyperlipidemia)   GERD (gastroesophageal reflux disease)   Diabetes (HCC)   SECONDARY DIAGNOSIS:   Past Medical History:  Diagnosis Date  . Arthritis    osteo  . Asthma   . Barrett's esophagus   . Colon polyp   . Diabetes mellitus without complication (HCC)   . GERD (gastroesophageal reflux disease)   . Hyperlipidemia   . Hypertension   . IBS (irritable bowel syndrome)   . Iron deficiency anemia   . Pancreatitis   . PONV (postoperative nausea and vomiting)   . PPD positive     HOSPITAL COURSE:   76 year old female with past medical history significant for hypertension, diabetes, hyperlipidemia and GERD presents to hospital secondary to worsening nausea and dizziness.  #1 acute cystitis-likely causing her weakness and nausea and dizziness. -Blood pressure is low, continue IV fluids -Check orthostatics. - on Rocephin - Ur cx had insignificant growth.  #2 hypertension-since blood pressure is low, decrease the dose of metoprolol and hold lisinopril for now  #3 diabetes mellitus with hypoglycemia today-secondary to infection. Hold  glimepiride for now. -Metformin can be restarted. Also on sliding scale insulin  #4 DVT prophylaxis-Lovenox  PT recommended outpatient services   DISCHARGE CONDITIONS:   Stable.  CONSULTS OBTAINED:    DRUG ALLERGIES:   Allergies  Allergen Reactions  . Augmentin [Amoxicillin-Pot Clavulanate] Diarrhea  and Swelling    Has patient had a PCN reaction causing immediate rash, facial/tongue/throat swelling, SOB or lightheadedness with hypotension: Yes Has patient had a PCN reaction causing severe rash involving mucus membranes or skin necrosis: No Has patient had a PCN reaction that required hospitalization: No Has patient had a PCN reaction occurring within the last 10 years: Yes If all of the above answers are "NO", then may proceed with Cephalosporin use.   . Codeine Swelling  . Demerol [Meperidine] Swelling  . Levaquin [Levofloxacin In D5w] Diarrhea and Swelling  . Milk-Related Compounds Nausea Only and Other (See Comments)    Gi upset (Significant milk intolerance)  . Mobic [Meloxicam] Hives and Shortness Of Breath  . Morphine And Related Swelling  . Other Swelling    Raisins   . Sulfa Antibiotics Swelling  . Wine [Alcohol] Swelling    DISCHARGE MEDICATIONS:   Discharge Medication List as of 02/04/2017  2:56 PM    START taking these medications   Details  cefUROXime (CEFTIN) 250 MG tablet Take 1 tablet (250 mg total) by mouth 2 (two) times daily., Starting Mon 02/04/2017, Until Thu 02/07/2017, Print    meclizine (ANTIVERT) 12.5 MG tablet Take 2 tablets (25 mg total) by mouth 3 (three) times daily as needed for dizziness., Starting Mon 02/04/2017, Print      CONTINUE these medications which have NOT CHANGED   Details  acetaminophen (TYLENOL) 325 MG tablet Take 2 tablets (650 mg total) by mouth every 4 (four) hours as needed for mild pain (or Temp > 100)., Starting Tue 11/09/2014, Normal    aspirin EC 81 MG  tablet Take 81 mg by mouth at bedtime., Historical Med    atorvastatin (LIPITOR) 40 MG tablet Take 20 mg by mouth at bedtime. , Starting Tue 03/30/2014, Historical Med    calcium citrate-vitamin D (CITRACAL+D) 315-200 MG-UNIT per tablet Take 1 tablet by mouth 2 (two) times a week. Tuesday & Friday, Historical Med    glimepiride (AMARYL) 4 MG tablet Take 2 mg by mouth 2 (two)  times daily. , Starting Tue 03/30/2014, Historical Med    Iron-Vitamin C (VITRON-C) 65-125 MG TABS Take 1 tablet by mouth 2 (two) times a week. Tuesday & Friday., Historical Med    lisinopril (PRINIVIL,ZESTRIL) 5 MG tablet Take 5 mg by mouth at bedtime. , Starting Tue 03/30/2014, Historical Med    loperamide (IMODIUM) 2 MG capsule Take 2 mg by mouth daily with supper. Hold with constipation (IBS), Historical Med    metFORMIN (GLUCOPHAGE) 500 MG tablet Take 1,000 mg by mouth 2 (two) times daily. , Starting Tue 03/30/2014, Historical Med    metoprolol succinate (TOPROL-XL) 100 MG 24 hr tablet Take 100 mg by mouth daily with breakfast. , Starting Tue 03/30/2014, Historical Med    Multiple Vitamin (MULTIVITAMIN WITH MINERALS) TABS tablet Take 1 tablet by mouth daily. Centrum silver, Historical Med    omeprazole (PRILOSEC) 20 MG capsule Take 20 mg by mouth daily before breakfast. barrett's esophagus, Starting Tue 03/30/2014, Historical Med    potassium chloride SA (K-DUR,KLOR-CON) 20 MEQ tablet Take 20 mEq by mouth daily with breakfast. , Starting Tue 03/30/2014, Historical Med    triamterene-hydrochlorothiazide (MAXZIDE-25) 37.5-25 MG per tablet Take 1 tablet by mouth daily with breakfast. , Starting Tue 03/30/2014, Historical Med    albuterol (PROVENTIL HFA;VENTOLIN HFA) 108 (90 BASE) MCG/ACT inhaler Inhale 2 puffs into the lungs every 4 (four) hours as needed (for shortness of breath/wheezing.). , Starting Wed 06/02/2014, Until Wed 12/25/2017, Historical Med    DIETARY MANAGEMENT PRODUCT PO Take 1 capsule by mouth daily after lunch. IBGARD FOR DIETARY MANAGEMENT OF IBS--AFTER 30 MINUTES OF EATING, Historical Med    HYDROcodone-acetaminophen (NORCO/VICODIN) 5-325 MG tablet Take 1-2 tablets by mouth every 4 (four) hours as needed for moderate pain., Starting Tue 01/08/2017, Print    methylcellulose (CITRUCEL) oral powder Take 1 packet by mouth daily before lunch., Historical Med    NONFORMULARY OR  COMPOUNDED ITEM Place 1 g vaginally every Monday, Wednesday, and Friday. At bedtime.  Estradiol 1g/per applicator prescribed by Dr. Jennell Corner, Starting Thu 11/08/2016, Historical Med    polyethylene glycol (MIRALAX / GLYCOLAX) packet Take 17 g by mouth daily as needed (for constipation. (IBS))., Historical Med         DISCHARGE INSTRUCTIONS:    Follow with PMD in 1-2 weeks.  If you experience worsening of your admission symptoms, develop shortness of breath, life threatening emergency, suicidal or homicidal thoughts you must seek medical attention immediately by calling 911 or calling your MD immediately  if symptoms less severe.  You Must read complete instructions/literature along with all the possible adverse reactions/side effects for all the Medicines you take and that have been prescribed to you. Take any new Medicines after you have completely understood and accept all the possible adverse reactions/side effects.   Please note  You were cared for by a hospitalist during your hospital stay. If you have any questions about your discharge medications or the care you received while you were in the hospital after you are discharged, you can call the unit and asked to speak with  the hospitalist on call if the hospitalist that took care of you is not available. Once you are discharged, your primary care physician will handle any further medical issues. Please note that NO REFILLS for any discharge medications will be authorized once you are discharged, as it is imperative that you return to your primary care physician (or establish a relationship with a primary care physician if you do not have one) for your aftercare needs so that they can reassess your need for medications and monitor your lab values.    Today   CHIEF COMPLAINT:   Chief Complaint  Patient presents with  . Nausea    HISTORY OF PRESENT ILLNESS:  [name]  is a 76 y.o. female presents with Nausea and  lightheadedness/dizziness. Patient states these symptoms started over the last 24 hours. Here in the ED workup was largely within normal limits, except for UA mildly suggestive of UTI. Patient was unsteady on her feet when she tried to walk, and so treatment of UTI was started and hospitalists were called for admission  VITAL SIGNS:  Blood pressure (!) 129/56, pulse 72, temperature 98.2 F (36.8 C), temperature source Oral, resp. rate 18, height  (1.549 m), weight 52.8 kg (116 lb 8 oz), SpO2 100 %.  I/O:  No intake or output data in the 24 hours ending 02/07/17 0747  PHYSICAL EXAMINATION:   GENERAL:  76 y.o.-year-old patient lying in the bed with no acute distress.  EYES: Pupils equal, round, reactive to light and accommodation. No scleral icterus. Extraocular muscles intact.  HEENT: Head atraumatic, normocephalic. Oropharynx and nasopharynx clear.  NECK:  Supple, no jugular venous distention. No thyroid enlargement, no tenderness.  LUNGS: Normal breath sounds bilaterally, no wheezing, rales,rhonchi or crepitation. No use of accessory muscles of respiration.  CARDIOVASCULAR: S1, S2 normal. No  rubs, or gallops. 3/6 systolic murmur is present ABDOMEN: Soft, nontender, nondistended. Bowel sounds present. No organomegaly or mass.  EXTREMITIES: No pedal edema, cyanosis, or clubbing.  NEUROLOGIC: Cranial nerves II through XII are intact. Muscle strength 5/5 in all extremities. Sensation intact. Gait not checked.  PSYCHIATRIC: The patient is alert and oriented x 3.  SKIN: No obvious rash, lesion, or ulcer.   DATA REVIEW:   CBC  Recent Labs Lab 02/03/17 0406  WBC 6.3  HGB 11.5*  HCT 33.5*  PLT 181    Chemistries   Recent Labs Lab 02/02/17 1827  02/04/17 0412  NA 137  < > 144  K 4.0  < > 3.6  CL 100*  < > 107  CO2 25  < > 26  GLUCOSE 263*  < > 80  BUN 20  < > 12  CREATININE 0.93  < > 0.70  CALCIUM 9.6  < > 8.3*  MG  --   --  1.1*  AST 30  --   --   ALT 18  --   --    ALKPHOS 87  --   --   BILITOT 0.4  --   --   < > = values in this interval not displayed.  Cardiac Enzymes  Recent Labs Lab 02/02/17 1827  TROPONINI <0.03    Microbiology Results  Results for orders placed or performed during the hospital encounter of 02/02/17  Urine culture     Status: Abnormal   Collection Time: 02/02/17  8:46 PM  Result Value Ref Range Status   Specimen Description URINE, RANDOM  Final   Special Requests NONE  Final  Culture (A)  Final    <10,000 COLONIES/mL INSIGNIFICANT GROWTH Performed at [facility], [address], [place], [place] [address]    Report Status 02/04/2017 FINAL  Final    RADIOLOGY:  No results found.  EKG:   Orders placed or performed during the hospital encounter of 02/02/17  . EKG 12-Lead  . EKG 12-Lead  . EKG      Management plans discussed with the patient, family and they are in agreement.  CODE STATUS:  Code Status History    Date Active Date Inactive Code Status Order ID Comments User Context   02/03/2017 12:06 AM 02/04/2017  7:11 PM Full Code [id number]  Oralia Manis, MD Inpatient   01/07/2017  1:43 PM 01/08/2017  4:18 PM Full Code [id number]  Schermerhorn, Ihor Austin, MD Inpatient   11/04/2014 10:01 PM 11/09/2014  5:25 PM Full Code [id number]  Nadeen Landau, MD Inpatient      TOTAL TIME TAKING CARE OF THIS PATIENT: 35 minutes.    Altamese Dilling M.D on 02/07/2017 at 7:47 AM  Between 7am to 6pm - Pager - [phone]  After 6pm go to [email or web address] - password [email or web address]  [facility]  Office  [phone]  CC: Primary care physician; Danella Penton, MD   Note: This dictation was prepared with Dragon dictation along with smaller phrase technology. Any transcriptional errors that result from this process are unintentional.

## 2017-04-29 ENCOUNTER — Other Ambulatory Visit: Payer: Self-pay | Admitting: Internal Medicine

## 2017-04-29 DIAGNOSIS — Z1231 Encounter for screening mammogram for malignant neoplasm of breast: Secondary | ICD-10-CM

## 2017-07-18 ENCOUNTER — Ambulatory Visit
Admission: RE | Admit: 2017-07-18 | Discharge: 2017-07-18 | Disposition: A | Discharge status: Home / Self Care | Payer: Medicare Other | Source: Ambulatory Visit | Attending: Internal Medicine | Admitting: Internal Medicine

## 2017-07-18 DIAGNOSIS — Z1231 Encounter for screening mammogram for malignant neoplasm of breast: Secondary | ICD-10-CM | POA: Diagnosis not present

## 2017-07-24 ENCOUNTER — Other Ambulatory Visit: Payer: Self-pay | Admitting: Internal Medicine

## 2017-07-24 DIAGNOSIS — R928 Other abnormal and inconclusive findings on diagnostic imaging of breast: Secondary | ICD-10-CM

## 2017-07-24 DIAGNOSIS — N6320 Unspecified lump in the left breast, unspecified quadrant: Secondary | ICD-10-CM

## 2017-08-02 ENCOUNTER — Ambulatory Visit
Admission: RE | Admit: 2017-08-02 | Discharge: 2017-08-02 | Disposition: A | Discharge status: Home / Self Care | Payer: Medicare Other | Source: Ambulatory Visit | Attending: Internal Medicine | Admitting: Internal Medicine

## 2017-08-02 DIAGNOSIS — R928 Other abnormal and inconclusive findings on diagnostic imaging of breast: Secondary | ICD-10-CM

## 2017-08-02 DIAGNOSIS — N6320 Unspecified lump in the left breast, unspecified quadrant: Secondary | ICD-10-CM

## 2017-08-05 ENCOUNTER — Other Ambulatory Visit: Payer: Self-pay | Admitting: Internal Medicine

## 2017-08-06 ENCOUNTER — Other Ambulatory Visit: Payer: Self-pay | Admitting: Internal Medicine

## 2017-08-06 DIAGNOSIS — N6320 Unspecified lump in the left breast, unspecified quadrant: Secondary | ICD-10-CM

## 2017-08-06 DIAGNOSIS — R928 Other abnormal and inconclusive findings on diagnostic imaging of breast: Secondary | ICD-10-CM

## 2017-08-14 ENCOUNTER — Ambulatory Visit
Admission: RE | Admit: 2017-08-14 | Discharge: 2017-08-14 | Disposition: A | Discharge status: Home / Self Care | Payer: Medicare Other | Source: Ambulatory Visit | Attending: Internal Medicine | Admitting: Internal Medicine

## 2017-08-14 DIAGNOSIS — N6321 Unspecified lump in the left breast, upper outer quadrant: Secondary | ICD-10-CM | POA: Diagnosis not present

## 2017-08-14 DIAGNOSIS — N6320 Unspecified lump in the left breast, unspecified quadrant: Secondary | ICD-10-CM

## 2017-08-14 DIAGNOSIS — R928 Other abnormal and inconclusive findings on diagnostic imaging of breast: Secondary | ICD-10-CM | POA: Diagnosis present

## 2017-08-14 DIAGNOSIS — N641 Fat necrosis of breast: Secondary | ICD-10-CM | POA: Diagnosis not present

## 2017-08-15 LAB — SURGICAL PATHOLOGY

## 2018-05-28 ENCOUNTER — Other Ambulatory Visit: Payer: Self-pay | Admitting: Internal Medicine

## 2018-05-28 DIAGNOSIS — Z1231 Encounter for screening mammogram for malignant neoplasm of breast: Secondary | ICD-10-CM

## 2018-09-04 ENCOUNTER — Ambulatory Visit: Admit: 2018-09-04 | Payer: PRIVATE HEALTH INSURANCE | Admitting: Gastroenterology

## 2018-09-04 SURGERY — ESOPHAGOGASTRODUODENOSCOPY (EGD) WITH PROPOFOL
Anesthesia: General

## 2018-10-13 ENCOUNTER — Other Ambulatory Visit: Payer: Self-pay

## 2018-10-13 ENCOUNTER — Ambulatory Visit
Admission: RE | Admit: 2018-10-13 | Discharge: 2018-10-13 | Disposition: A | Discharge status: Home / Self Care | Payer: Medicare Other | Source: Ambulatory Visit | Attending: Internal Medicine | Admitting: Internal Medicine

## 2018-10-13 DIAGNOSIS — Z1231 Encounter for screening mammogram for malignant neoplasm of breast: Secondary | ICD-10-CM | POA: Diagnosis not present

## 2019-07-07 ENCOUNTER — Other Ambulatory Visit: Payer: Self-pay | Admitting: Internal Medicine

## 2019-07-07 DIAGNOSIS — Z1231 Encounter for screening mammogram for malignant neoplasm of breast: Secondary | ICD-10-CM

## 2019-10-14 ENCOUNTER — Ambulatory Visit
Admission: RE | Admit: 2019-10-14 | Discharge: 2019-10-14 | Disposition: A | Discharge status: Home / Self Care | Payer: Medicare Other | Source: Ambulatory Visit | Attending: Internal Medicine | Admitting: Internal Medicine

## 2019-10-14 DIAGNOSIS — Z1231 Encounter for screening mammogram for malignant neoplasm of breast: Secondary | ICD-10-CM | POA: Diagnosis present

## 2019-12-03 ENCOUNTER — Other Ambulatory Visit
Admission: RE | Admit: 2019-12-03 | Discharge: 2019-12-03 | Disposition: A | Discharge status: Home / Self Care | Payer: Medicare Other | Source: Ambulatory Visit | Attending: Internal Medicine | Admitting: Internal Medicine

## 2019-12-03 ENCOUNTER — Other Ambulatory Visit: Payer: Self-pay

## 2019-12-03 DIAGNOSIS — Z01812 Encounter for preprocedural laboratory examination: Secondary | ICD-10-CM | POA: Diagnosis present

## 2019-12-03 DIAGNOSIS — Z20822 Contact with and (suspected) exposure to covid-19: Secondary | ICD-10-CM | POA: Insufficient documentation

## 2019-12-04 ENCOUNTER — Encounter: Payer: Self-pay | Admitting: Internal Medicine

## 2019-12-04 LAB — SARS CORONAVIRUS 2 (TAT 6-24 HRS): SARS Coronavirus 2: NEGATIVE

## 2019-12-07 ENCOUNTER — Ambulatory Visit: Payer: Medicare Other | Admitting: Certified Registered Nurse Anesthetist

## 2019-12-07 ENCOUNTER — Encounter: Payer: Self-pay | Admitting: Internal Medicine

## 2019-12-07 ENCOUNTER — Other Ambulatory Visit: Payer: Self-pay

## 2019-12-07 ENCOUNTER — Encounter
Admission: RE | Disposition: A | Discharge status: Home / Self Care | Payer: Self-pay | Source: Home / Self Care | Attending: Internal Medicine

## 2019-12-07 ENCOUNTER — Ambulatory Visit
Admission: RE | Admit: 2019-12-07 | Discharge: 2019-12-07 | Disposition: A | Discharge status: Home / Self Care | Payer: Medicare Other | Attending: Internal Medicine | Admitting: Internal Medicine

## 2019-12-07 DIAGNOSIS — K219 Gastro-esophageal reflux disease without esophagitis: Secondary | ICD-10-CM | POA: Insufficient documentation

## 2019-12-07 DIAGNOSIS — K621 Rectal polyp: Secondary | ICD-10-CM | POA: Insufficient documentation

## 2019-12-07 DIAGNOSIS — Z79899 Other long term (current) drug therapy: Secondary | ICD-10-CM | POA: Insufficient documentation

## 2019-12-07 DIAGNOSIS — Z888 Allergy status to other drugs, medicaments and biological substances status: Secondary | ICD-10-CM | POA: Diagnosis not present

## 2019-12-07 DIAGNOSIS — K648 Other hemorrhoids: Secondary | ICD-10-CM | POA: Insufficient documentation

## 2019-12-07 DIAGNOSIS — K5730 Diverticulosis of large intestine without perforation or abscess without bleeding: Secondary | ICD-10-CM | POA: Diagnosis not present

## 2019-12-07 DIAGNOSIS — E119 Type 2 diabetes mellitus without complications: Secondary | ICD-10-CM | POA: Insufficient documentation

## 2019-12-07 DIAGNOSIS — Z7984 Long term (current) use of oral hypoglycemic drugs: Secondary | ICD-10-CM | POA: Diagnosis not present

## 2019-12-07 DIAGNOSIS — J45909 Unspecified asthma, uncomplicated: Secondary | ICD-10-CM | POA: Diagnosis not present

## 2019-12-07 DIAGNOSIS — I10 Essential (primary) hypertension: Secondary | ICD-10-CM | POA: Insufficient documentation

## 2019-12-07 DIAGNOSIS — Z7982 Long term (current) use of aspirin: Secondary | ICD-10-CM | POA: Insufficient documentation

## 2019-12-07 DIAGNOSIS — Z882 Allergy status to sulfonamides status: Secondary | ICD-10-CM | POA: Insufficient documentation

## 2019-12-07 DIAGNOSIS — Z86010 Personal history of colonic polyps: Secondary | ICD-10-CM | POA: Insufficient documentation

## 2019-12-07 DIAGNOSIS — Z885 Allergy status to narcotic agent status: Secondary | ICD-10-CM | POA: Insufficient documentation

## 2019-12-07 DIAGNOSIS — Z880 Allergy status to penicillin: Secondary | ICD-10-CM | POA: Insufficient documentation

## 2019-12-07 DIAGNOSIS — E785 Hyperlipidemia, unspecified: Secondary | ICD-10-CM | POA: Diagnosis not present

## 2019-12-07 DIAGNOSIS — K2270 Barrett's esophagus without dysplasia: Secondary | ICD-10-CM | POA: Diagnosis not present

## 2019-12-07 DIAGNOSIS — Z1211 Encounter for screening for malignant neoplasm of colon: Secondary | ICD-10-CM | POA: Diagnosis present

## 2019-12-07 DIAGNOSIS — M1990 Unspecified osteoarthritis, unspecified site: Secondary | ICD-10-CM | POA: Diagnosis not present

## 2019-12-07 DIAGNOSIS — K449 Diaphragmatic hernia without obstruction or gangrene: Secondary | ICD-10-CM | POA: Insufficient documentation

## 2019-12-07 DIAGNOSIS — K589 Irritable bowel syndrome without diarrhea: Secondary | ICD-10-CM | POA: Insufficient documentation

## 2019-12-07 DIAGNOSIS — Z881 Allergy status to other antibiotic agents status: Secondary | ICD-10-CM | POA: Diagnosis not present

## 2019-12-07 HISTORY — PX: ESOPHAGOGASTRODUODENOSCOPY (EGD) WITH PROPOFOL: SHX5813

## 2019-12-07 HISTORY — PX: COLONOSCOPY WITH PROPOFOL: SHX5780

## 2019-12-07 LAB — GLUCOSE, CAPILLARY: Glucose-Capillary: 147 mg/dL — ABNORMAL HIGH (ref 70–99)

## 2019-12-07 SURGERY — ESOPHAGOGASTRODUODENOSCOPY (EGD) WITH PROPOFOL
Anesthesia: General

## 2019-12-07 MED ORDER — LIDOCAINE HCL (CARDIAC) PF 100 MG/5ML IV SOSY
PREFILLED_SYRINGE | INTRAVENOUS | Status: DC | PRN
  Administered 2019-12-07: 50 mg via INTRAVENOUS

## 2019-12-07 MED ORDER — ONDANSETRON HCL 4 MG/2ML IJ SOLN
INTRAMUSCULAR | Status: DC | PRN
  Administered 2019-12-07: 4 mg via INTRAVENOUS

## 2019-12-07 MED ORDER — ONDANSETRON HCL 4 MG/2ML IJ SOLN: 4.0000 mg | Freq: Once | INTRAMUSCULAR | Status: DC | PRN

## 2019-12-07 MED ORDER — SODIUM CHLORIDE 0.9 % IV SOLN
INTRAVENOUS | Status: DC
  Administered 2019-12-07: 20 mL/h via INTRAVENOUS

## 2019-12-07 MED ORDER — PROPOFOL 500 MG/50ML IV EMUL
INTRAVENOUS | Status: DC | PRN
  Administered 2019-12-07: 20 mg via INTRAVENOUS
  Administered 2019-12-07: 140 ug/kg/min via INTRAVENOUS

## 2019-12-07 MED ORDER — PHENYLEPHRINE HCL (PRESSORS) 10 MG/ML IV SOLN
INTRAVENOUS | Status: DC | PRN
  Administered 2019-12-07: 100 ug via INTRAVENOUS

## 2019-12-07 NOTE — H&P (Signed)
Outpatient Phillips stay form Pre-procedure 12/07/2019 9:09 AM [name] [name], M.D.  Primary Physician: Bethann Punches, M.D.  Reason for visit:  Personal history of multiple adenomatous colon polyps (June 2018)  History of present illness:                            Patient presents for colonoscopy for a personal hx of colon polyps. The patient denies abdominal pain, abnormal weight loss or rectal bleeding.      Current Facility-Administered Medications:  .  0.9 %  sodium chloride infusion, , Intravenous, Continuous, [place], Boykin Nearing, MD, Last Rate: 20 mL/hr at 12/07/19 1610, Continued from Pre-op at 12/07/19 0817  Medications Prior to Admission  Medication Sig Dispense Refill Last Dose  . acetaminophen (TYLENOL) 325 MG tablet Take 2 tablets (650 mg total) by mouth every 4 (four) hours as needed for mild pain (or Temp > 100). 30 tablet 1 Past Week at Unknown time  . aspirin EC 81 MG tablet Take 81 mg by mouth at bedtime.   Past Week at Unknown time  . atorvastatin (LIPITOR) 40 MG tablet Take 20 mg by mouth at bedtime.    Past Week at Unknown time  . calcium citrate-vitamin D (CITRACAL+D) 315-200 MG-UNIT per tablet Take 1 tablet by mouth 2 (two) times a week. Tuesday & Friday   Past Week at Unknown time  . DIETARY MANAGEMENT PRODUCT PO Take 1 capsule by mouth daily after lunch. IBGARD FOR DIETARY MANAGEMENT OF IBS--AFTER 30 MINUTES OF EATING   Past Week at Unknown time  . glimepiride (AMARYL) 4 MG tablet Take 2 mg by mouth 2 (two) times daily.    Past Week at Unknown time  . HYDROcodone-acetaminophen (NORCO/VICODIN) 5-325 MG tablet Take 1-2 tablets by mouth every 4 (four) hours as needed for moderate pain. 30 tablet 0 Past Week at Unknown time  . Iron-Vitamin C (VITRON-C) 65-125 MG TABS Take 1 tablet by mouth 2 (two) times a week. Tuesday & Friday.   Past Week at Unknown time  . lisinopril (PRINIVIL,ZESTRIL) 5 MG tablet Take 5 mg by mouth at bedtime.    Past Week at Unknown time  . loperamide  (IMODIUM) 2 MG capsule Take 2 mg by mouth daily with supper. Hold with constipation (IBS)   Past Week at Unknown time  . meclizine (ANTIVERT) 12.5 MG tablet Take 2 tablets (25 mg total) by mouth 3 (three) times daily as needed for dizziness. 60 tablet 0 Past Week at Unknown time  . metFORMIN (GLUCOPHAGE) 500 MG tablet Take 1,000 mg by mouth 2 (two) times daily.    Past Week at Unknown time  . methylcellulose (CITRUCEL) oral powder Take 1 packet by mouth daily before lunch.   Past Week at Unknown time  . metoprolol succinate (TOPROL-XL) 100 MG 24 hr tablet Take 100 mg by mouth daily with breakfast.    12/07/2019 at 0500  . Multiple Vitamin (MULTIVITAMIN WITH MINERALS) TABS tablet Take 1 tablet by mouth daily. Centrum silver   Past Week at Unknown time  . NONFORMULARY OR COMPOUNDED ITEM Place 1 g vaginally every Monday, Wednesday, and Friday. At bedtime.  Estradiol 1g/per applicator prescribed by Dr. Jennell Corner   Past Week at Unknown time  . omeprazole (PRILOSEC) 20 MG capsule Take 20 mg by mouth daily before breakfast. barrett's esophagus   Past Week at Unknown time  . polyethylene glycol (MIRALAX / GLYCOLAX) packet Take 17 g by mouth daily  as needed (for constipation. (IBS)).   Past Week at Unknown time  . potassium chloride SA (K-DUR,KLOR-CON) 20 MEQ tablet Take 20 mEq by mouth daily with breakfast.    Past Week at Unknown time  . triamterene-hydrochlorothiazide (MAXZIDE-25) 37.5-25 MG per tablet Take 1 tablet by mouth daily with breakfast.    Past Week at Unknown time  . albuterol (PROVENTIL HFA;VENTOLIN HFA) 108 (90 BASE) MCG/ACT inhaler Inhale 2 puffs into the lungs every 4 (four) hours as needed (for shortness of breath/wheezing.).         Allergies  Allergen Reactions  . Augmentin [Amoxicillin-Pot Clavulanate] Diarrhea and Swelling    Has patient had a PCN reaction causing immediate rash, facial/tongue/throat swelling, SOB or lightheadedness with hypotension: Yes Has patient had a PCN  reaction causing severe rash involving mucus membranes or skin necrosis: No Has patient had a PCN reaction that required hospitalization: No Has patient had a PCN reaction occurring within the last 10 years: Yes If all of the above answers are "NO", then may proceed with Cephalosporin use.   . Codeine Swelling  . Demerol [Meperidine] Swelling  . Levaquin [Levofloxacin In D5w] Diarrhea and Swelling  . Milk-Related Compounds Nausea Only and Other (See Comments)    Gi upset (Significant milk intolerance)  . Mobic [Meloxicam] Hives and Shortness Of Breath  . Morphine And Related Swelling  . Other Swelling    Raisins   . Sulfa Antibiotics Swelling  . Wine [Alcohol] Swelling     Past Medical History:  Diagnosis Date  . Arthritis    osteo  . Asthma   . Barrett's esophagus   . Colon polyp   . Diabetes mellitus without complication (HCC)   . GERD (gastroesophageal reflux disease)   . Hyperlipidemia   . Hypertension   . IBS (irritable bowel syndrome)   . Iron deficiency anemia   . Pancreatitis   . PONV (postoperative nausea and vomiting)   . PPD positive     Review of systems:  Otherwise negative.    Physical Exam  Gen: Alert, oriented. Appears stated age.  HEENT: [place]/AT. PERRLA. Lungs: CTA, no wheezes. CV: RR nl S1, S2. Abd: soft, benign, no masses. BS+ Ext: No edema. Pulses 2+    Planned procedures: Proceed with colonoscopy. The patient understands the nature of the planned procedure, indications, risks, alternatives and potential complications including but not limited to bleeding, infection, perforation, damage to internal organs and possible oversedation/side effects from anesthesia. The patient agrees and gives consent to proceed.  Please refer to procedure notes for findings, recommendations and patient disposition/instructions.     [name] [name], M.D. Gastroenterology 12/07/2019  9:09 AM

## 2019-12-07 NOTE — Interval H&P Note (Signed)
History and Physical Interval Note:  12/07/2019 9:17 AM  [name]  has presented today for surgery, with the diagnosis of ph polyps.  The various methods of treatment have been discussed with the patient and family. After consideration of risks, benefits and other options for treatment, the patient has consented to  Procedure(s): ESOPHAGOGASTRODUODENOSCOPY (EGD) WITH PROPOFOL (N/A) COLONOSCOPY WITH PROPOFOL (N/A) as a surgical intervention.  The patient's history has been reviewed, patient examined, no change in status, stable for surgery.  I have reviewed the patient's chart and labs.  Questions were answered to the patient's satisfaction.     [place], [place]

## 2019-12-07 NOTE — Transfer of Care (Signed)
Immediate Anesthesia Transfer of Care Note  Patient: [name]  Procedure(s) Performed: ESOPHAGOGASTRODUODENOSCOPY (EGD) WITH PROPOFOL (N/A ) COLONOSCOPY WITH PROPOFOL (N/A )  Patient Location: PACU  Anesthesia Type:General  Level of Consciousness: awake, alert  and oriented  Airway & Oxygen Therapy: Patient Spontanous Breathing  Post-op Assessment: Report given to RN and Post -op Vital signs reviewed and stable  Post vital signs: Reviewed and stable  Last Vitals:  Vitals Value Taken Time  BP    Temp    Pulse 78 12/07/19 0950  Resp    SpO2 100 % 12/07/19 0950  Vitals shown include unvalidated device data.  Last Pain:  Vitals:   12/07/19 0755  TempSrc: Temporal  PainSc: 5          Complications: No complications documented.

## 2019-12-07 NOTE — Interval H&P Note (Signed)
History and Physical Interval Note:  12/07/2019 9:10 AM  [name]  has presented today for surgery, with the diagnosis of ph polyps.  The various methods of treatment have been discussed with the patient and family. After consideration of risks, benefits and other options for treatment, the patient has consented to  Procedure(s): ESOPHAGOGASTRODUODENOSCOPY (EGD) WITH PROPOFOL (N/A) COLONOSCOPY WITH PROPOFOL (N/A) as a surgical intervention.  The patient's history has been reviewed, patient examined, no change in status, stable for surgery.  I have reviewed the patient's chart and labs.  Questions were answered to the patient's satisfaction.     [place], [place]

## 2019-12-07 NOTE — Op Note (Signed)
[facility] Gastroenterology Patient Name: [name] Procedure Date: 12/07/2019 9:16 AM MRN: [id number] Account #: [id number] Date of Birth: February 06, 1941 Admit Type: Outpatient Age: 79 Room: [facility] ENDO ROOM 3 Gender: Female Note Status: Finalized Procedure:             Upper GI endoscopy Indications:           Surveillance for malignancy due to personal history of                         Barrett's esophagus Providers:             Boykin Nearing. Norma Fredrickson MD, MD Referring MD:          Danella Penton, MD (Referring MD) Medicines:             Propofol per Anesthesia Complications:         No immediate complications. Procedure:             Pre-Anesthesia Assessment:                        - The risks and benefits of the procedure and the                         sedation options and risks were discussed with the                         patient. All questions were answered and informed                         consent was obtained.                        - Patient identification and proposed procedure were                         verified prior to the procedure by the nurse. The                         procedure was verified in the procedure room.                        - ASA Grade Assessment: III - A patient with severe                         systemic disease.                        - After reviewing the risks and benefits, the patient                         was deemed in satisfactory condition to undergo the                         procedure.                        After obtaining informed consent, the endoscope was                         passed under direct vision.  Throughout the procedure,                         the patient's blood pressure, pulse, and oxygen                         saturations were monitored continuously. The Endoscope                         was introduced through the mouth, and advanced to the                         third part of duodenum. The  upper GI endoscopy was                         accomplished without difficulty. The patient tolerated                         the procedure well. Findings:      The esophagus and gastroesophageal junction were examined with white       light. There was no visual evidence of Barrett's esophagus. Mucosa was       biopsied with a cold forceps for histology in 4 quadrants at the       gastroesophageal junction. One specimen bottle was sent to pathology.      Prominent esophageal veins present. No clear evidence of esophageal       varices.      Evidence of a prior Nissen fundoplication was found in the cardia. This       was characterized by healthy appearing mucosa and a non-intact       appearance.      A 4 cm hiatal hernia was present.      The examined duodenum was normal.      The exam was otherwise without abnormality. Impression:            - There is no endoscopic evidence of Barrett's                         esophagus. Biopsied.                        - A Nissen fundoplication was found, characterized by                         healthy appearing mucosa and a non-intact appearance.                        - 4 cm hiatal hernia.                        - Normal examined duodenum.                        - The examination was otherwise normal. Recommendation:        - Await pathology results.                        - Proceed with colonoscopy Procedure Code(s):     --- Professional ---  31497, Esophagogastroduodenoscopy, flexible,                         transoral; with biopsy, single or multiple Diagnosis Code(s):     --- Professional ---                        K44.9, Diaphragmatic hernia without obstruction or                         gangrene                        K91.89, Other postprocedural complications and                         disorders of digestive system                        Z98.890, Other specified postprocedural states                         K22.70, Barrett's esophagus without dysplasia CPT copyright 2019 American Medical Association. All rights reserved. The codes documented in this report are preliminary and upon coder review may  be revised to meet current compliance requirements. Stanton Kidney MD, MD 12/07/2019 9:33:10 AM This report has been signed electronically. Number of Addenda: 0 Note Initiated On: 12/07/2019 9:16 AM Estimated Blood Loss:  Estimated blood loss: none.      [facility]

## 2019-12-07 NOTE — Op Note (Signed)
[facility] Gastroenterology Patient Name: [name] Procedure Date: 12/07/2019 9:15 AM MRN: [id number] Account #: [id number] Date of Birth: 1940-05-10 Admit Type: Outpatient Age: 79 Room: [facility] ENDO ROOM 3 Gender: Female Note Status: Finalized Procedure:             Colonoscopy Indications:           High risk colon cancer surveillance: Personal history                         of multiple (3 or more) adenomas Providers:             Royce Macadamia K. Norma Fredrickson MD, MD Referring MD:          Danella Penton, MD (Referring MD) Medicines:             Propofol per Anesthesia Complications:         No immediate complications. Procedure:             Pre-Anesthesia Assessment:                        - The risks and benefits of the procedure and the                         sedation options and risks were discussed with the                         patient. All questions were answered and informed                         consent was obtained.                        - Patient identification and proposed procedure were                         verified prior to the procedure by the nurse. The                         procedure was verified in the procedure room.                        - ASA Grade Assessment: III - A patient with severe                         systemic disease.                        - After reviewing the risks and benefits, the patient                         was deemed in satisfactory condition to undergo the                         procedure.                        After obtaining informed consent, the colonoscope was                         passed under direct  vision. Throughout the procedure,                         the patient's blood pressure, pulse, and oxygen                         saturations were monitored continuously. The                         Colonoscope was introduced through the anus and                         advanced to the the cecum,  identified by appendiceal                         orifice and ileocecal valve. The colonoscopy was                         performed without difficulty. The patient tolerated                         the procedure well. The quality of the bowel                         preparation was excellent. The ileocecal valve,                         appendiceal orifice, and rectum were photographed. Findings:      The perianal and digital rectal examinations were normal. Pertinent       negatives include normal sphincter tone and no palpable rectal lesions.      Multiple small and large-mouthed diverticula were found in the sigmoid       colon and ascending colon.      A 4 mm polyp was found in the rectum. The polyp was sessile. The polyp       was removed with a cold biopsy forceps. Resection and retrieval were       complete.      Non-bleeding internal hemorrhoids were found during retroflexion. The       hemorrhoids were Grade I (internal hemorrhoids that do not prolapse).      The exam was otherwise without abnormality. Impression:            - Diverticulosis in the sigmoid colon and in the                         ascending colon.                        - One 4 mm polyp in the rectum, removed with a cold                         biopsy forceps. Resected and retrieved.                        - Non-bleeding internal hemorrhoids.                        - The examination was otherwise normal. Recommendation:        - Patient has a  contact number available for                         emergencies. The signs and symptoms of potential                         delayed complications were discussed with the patient.                         Return to normal activities tomorrow. Written                         discharge instructions were provided to the patient.                        - Resume previous diet.                        - Continue present medications.                        - No repeat colonoscopy  due to current age (72 years                         or older).                        - Return to GI office PRN.                        - The findings and recommendations were discussed with                         the patient. Procedure Code(s):     --- Professional ---                        9806221342, Colonoscopy, flexible; with biopsy, single or                         multiple CPT copyright 2019 American Medical Association. All rights reserved. The codes documented in this report are preliminary and upon coder review may  be revised to meet current compliance requirements. Stanton Kidney MD, MD 12/07/2019 9:50:19 AM This report has been signed electronically. Number of Addenda: 0 Note Initiated On: 12/07/2019 9:15 AM Scope Withdrawal Time: 0 hours 6 minutes 52 seconds  Total Procedure Duration: 0 hours 11 minutes 39 seconds  Estimated Blood Loss:  Estimated blood loss: none.      [facility]

## 2019-12-07 NOTE — H&P (Signed)
Outpatient short stay form Pre-procedure 12/07/2019 9:16 AM [name], M.D.  Primary Physician: Bethann Punches, M.D.  Reason for visit:  Barrett's Esophagus, GERD, personal history of multiple colon polyps  History of present illness: AS above. Patient denies intractable heartburn, dysphagia, hemetemesis, abdominal pain, nausea or vomiting.                           Patient presents for colonoscopy for a personal hx of colon polyps. The patient denies abdominal pain, abnormal weight loss or rectal bleeding.      Current Facility-Administered Medications:  .  0.9 %  sodium chloride infusion, , Intravenous, Continuous, [place], Boykin Nearing, MD, Last Rate: 20 mL/hr at 12/07/19 0915, Continued from Pre-op at 12/07/19 0915  Medications Prior to Admission  Medication Sig Dispense Refill Last Dose  . acetaminophen (TYLENOL) 325 MG tablet Take 2 tablets (650 mg total) by mouth every 4 (four) hours as needed for mild pain (or Temp > 100). 30 tablet 1 Past Week at Unknown time  . aspirin EC 81 MG tablet Take 81 mg by mouth at bedtime.   Past Week at Unknown time  . atorvastatin (LIPITOR) 40 MG tablet Take 20 mg by mouth at bedtime.    Past Week at Unknown time  . calcium citrate-vitamin D (CITRACAL+D) 315-200 MG-UNIT per tablet Take 1 tablet by mouth 2 (two) times a week. Tuesday & Friday   Past Week at Unknown time  . DIETARY MANAGEMENT PRODUCT PO Take 1 capsule by mouth daily after lunch. IBGARD FOR DIETARY MANAGEMENT OF IBS--AFTER 30 MINUTES OF EATING   Past Week at Unknown time  . glimepiride (AMARYL) 4 MG tablet Take 2 mg by mouth 2 (two) times daily.    Past Week at Unknown time  . HYDROcodone-acetaminophen (NORCO/VICODIN) 5-325 MG tablet Take 1-2 tablets by mouth every 4 (four) hours as needed for moderate pain. 30 tablet 0 Past Week at Unknown time  . Iron-Vitamin C (VITRON-C) 65-125 MG TABS Take 1 tablet by mouth 2 (two) times a week. Tuesday & Friday.   Past Week at Unknown time  .  lisinopril (PRINIVIL,ZESTRIL) 5 MG tablet Take 5 mg by mouth at bedtime.    Past Week at Unknown time  . loperamide (IMODIUM) 2 MG capsule Take 2 mg by mouth daily with supper. Hold with constipation (IBS)   Past Week at Unknown time  . meclizine (ANTIVERT) 12.5 MG tablet Take 2 tablets (25 mg total) by mouth 3 (three) times daily as needed for dizziness. 60 tablet 0 Past Week at Unknown time  . metFORMIN (GLUCOPHAGE) 500 MG tablet Take 1,000 mg by mouth 2 (two) times daily.    Past Week at Unknown time  . methylcellulose (CITRUCEL) oral powder Take 1 packet by mouth daily before lunch.   Past Week at Unknown time  . metoprolol succinate (TOPROL-XL) 100 MG 24 hr tablet Take 100 mg by mouth daily with breakfast.    12/07/2019 at 0500  . Multiple Vitamin (MULTIVITAMIN WITH MINERALS) TABS tablet Take 1 tablet by mouth daily. Centrum silver   Past Week at Unknown time  . NONFORMULARY OR COMPOUNDED ITEM Place 1 g vaginally every Monday, Wednesday, and Friday. At bedtime.  Estradiol 1g/per applicator prescribed by Dr. Jennell Corner   Past Week at Unknown time  . omeprazole (PRILOSEC) 20 MG capsule Take 20 mg by mouth daily before breakfast. barrett's esophagus   Past Week at Unknown time  .  polyethylene glycol (MIRALAX / GLYCOLAX) packet Take 17 g by mouth daily as needed (for constipation. (IBS)).   Past Week at Unknown time  . potassium chloride SA (K-DUR,KLOR-CON) 20 MEQ tablet Take 20 mEq by mouth daily with breakfast.    Past Week at Unknown time  . triamterene-hydrochlorothiazide (MAXZIDE-25) 37.5-25 MG per tablet Take 1 tablet by mouth daily with breakfast.    Past Week at Unknown time  . albuterol (PROVENTIL HFA;VENTOLIN HFA) 108 (90 BASE) MCG/ACT inhaler Inhale 2 puffs into the lungs every 4 (four) hours as needed (for shortness of breath/wheezing.).         Allergies  Allergen Reactions  . Augmentin [Amoxicillin-Pot Clavulanate] Diarrhea and Swelling    Has patient had a PCN reaction  causing immediate rash, facial/tongue/throat swelling, SOB or lightheadedness with hypotension: Yes Has patient had a PCN reaction causing severe rash involving mucus membranes or skin necrosis: No Has patient had a PCN reaction that required hospitalization: No Has patient had a PCN reaction occurring within the last 10 years: Yes If all of the above answers are "NO", then may proceed with Cephalosporin use.   . Codeine Swelling  . Demerol [Meperidine] Swelling  . Levaquin [Levofloxacin In D5w] Diarrhea and Swelling  . Milk-Related Compounds Nausea Only and Other (See Comments)    Gi upset (Significant milk intolerance)  . Mobic [Meloxicam] Hives and Shortness Of Breath  . Morphine And Related Swelling  . Other Swelling    Raisins   . Sulfa Antibiotics Swelling  . Wine [Alcohol] Swelling     Past Medical History:  Diagnosis Date  . Arthritis    osteo  . Asthma   . Barrett's esophagus   . Colon polyp   . Diabetes mellitus without complication (HCC)   . GERD (gastroesophageal reflux disease)   . Hyperlipidemia   . Hypertension   . IBS (irritable bowel syndrome)   . Iron deficiency anemia   . Pancreatitis   . PONV (postoperative nausea and vomiting)   . PPD positive     Review of systems:  Otherwise negative.    Physical Exam  Gen: Alert, oriented. Appears stated age.  HEENT: [place]/AT. PERRLA. Lungs: CTA, no wheezes. CV: RR nl S1, S2. Abd: soft, benign, no masses. BS+ Ext: No edema. Pulses 2+    Planned procedures: Proceed with EGD and colonoscopy. The patient understands the nature of the planned procedure, indications, risks, alternatives and potential complications including but not limited to bleeding, infection, perforation, damage to internal organs and possible oversedation/side effects from anesthesia. The patient agrees and gives consent to proceed.  Please refer to procedure notes for findings, recommendations and patient disposition/instructions.      Rian Busche K. Norma Phillips, M.D. Gastroenterology 12/07/2019  9:16 AM

## 2019-12-07 NOTE — Anesthesia Postprocedure Evaluation (Signed)
Anesthesia Post Note  Patient: VENORA KAUTZMAN  Procedure(s) Performed: ESOPHAGOGASTRODUODENOSCOPY (EGD) WITH PROPOFOL (N/A ) COLONOSCOPY WITH PROPOFOL (N/A )  Patient location during evaluation: Endoscopy Anesthesia Type: General Level of consciousness: awake and alert and oriented Pain management: pain level controlled Vital Signs Assessment: post-procedure vital signs reviewed and stable Respiratory status: spontaneous breathing Cardiovascular status: blood pressure returned to baseline Anesthetic complications: no   No complications documented.   Last Vitals:  Vitals:   12/07/19 1010 12/07/19 1020  BP: 105/84 (!) 141/75  Pulse: 83 80  Resp: 12 16  Temp:    SpO2: 96% 100%    Last Pain:  Vitals:   12/07/19 1020  TempSrc:   PainSc: 0-No pain                 Izzabelle Bouley

## 2019-12-07 NOTE — Anesthesia Preprocedure Evaluation (Signed)
Anesthesia Evaluation  Patient identified by MRN, date of birth, ID band Patient awake    Reviewed: Allergy & Precautions, NPO status , Patient's Chart, lab work & pertinent test results  History of Anesthesia Complications (+) PONV and history of anesthetic complications  Airway Mallampati: II       Dental  (+) Upper Dentures, Partial Lower   Pulmonary asthma , neg sleep apnea, neg COPD, Not current smoker,           Cardiovascular hypertension, Pt. on medications and Pt. on home beta blockers (-) Past MI and (-) CHF (-) dysrhythmias (-) Valvular Problems/Murmurs     Neuro/Psych neg Seizures negative neurological ROS  negative psych ROS   GI/Hepatic Neg liver ROS, hiatal hernia, GERD  Medicated and Controlled,  Endo/Other  diabetes, Type 2, Oral Hypoglycemic Agents  Renal/GU negative Renal ROS     Musculoskeletal  (+) Arthritis , Osteoarthritis,    Abdominal   Peds  Hematology  (+) anemia ,   Anesthesia Other Findings   Reproductive/Obstetrics                             Anesthesia Physical  Anesthesia Plan  ASA: III  Anesthesia Plan: General   Post-op Pain Management:    Induction: Inhalational  PONV Risk Score and Plan: Propofol infusion  Airway Management Planned: Nasal Cannula  Additional Equipment:   Intra-op Plan:   Post-operative Plan:   Informed Consent: I have reviewed the patients History and Physical, chart, labs and discussed the procedure including the risks, benefits and alternatives for the proposed anesthesia with the patient or authorized representative who has indicated his/her understanding and acceptance.     Dental advisory given  Plan Discussed with: CRNA and Surgeon  Anesthesia Plan Comments:         Anesthesia Quick Evaluation

## 2019-12-08 ENCOUNTER — Encounter: Payer: Self-pay | Admitting: Internal Medicine

## 2019-12-08 LAB — SURGICAL PATHOLOGY
